# Patient Record
Sex: Female | Born: 1980 | Race: White | Hispanic: No | Marital: Single | Smoking: Former smoker
Health system: Southern US, Community
[De-identification: ages and names within clinical notes are randomized; demographics above are authoritative.]

## PROBLEM LIST (undated history)

## (undated) DIAGNOSIS — L0591 Pilonidal cyst without abscess: Secondary | ICD-10-CM

## (undated) HISTORY — DX: Pilonidal cyst without abscess: L05.91

---

## 2003-05-10 HISTORY — PX: PILONIDAL CYST EXCISION: SHX744

## 2013-08-05 ENCOUNTER — Ambulatory Visit: Payer: Self-pay | Admitting: Family Medicine

## 2013-08-08 ENCOUNTER — Ambulatory Visit (INDEPENDENT_AMBULATORY_CARE_PROVIDER_SITE_OTHER): Payer: BC Managed Care – PPO | Admitting: General Surgery

## 2013-08-08 ENCOUNTER — Encounter: Payer: Self-pay | Admitting: General Surgery

## 2013-08-08 VITALS — BP 124/76 | HR 74 | Temp 97.2°F | Resp 14 | Ht 60.0 in | Wt 243.0 lb

## 2013-08-08 DIAGNOSIS — R112 Nausea with vomiting, unspecified: Secondary | ICD-10-CM

## 2013-08-08 DIAGNOSIS — L0591 Pilonidal cyst without abscess: Secondary | ICD-10-CM

## 2013-08-08 MED ORDER — AMOXICILLIN-POT CLAVULANATE 875-125 MG PO TABS: 1.0000 | ORAL_TABLET | Freq: Two times a day (BID) | ORAL | Status: DC

## 2013-08-08 NOTE — Patient Instructions (Signed)
The patient is aware to call back for any questions or concerns.  

## 2013-08-08 NOTE — Progress Notes (Signed)
Patient ID: Diana Cunningham, female   DOB: 1981/04/28, 33 y.o.   MRN: [id number]  Chief Complaint  Patient presents with  . Other    evaluation of pilonidal cyst    HPI Diana Cunningham is a 33 y.o. female here today for a evaluation of pilonidal cyst. States she has been having pain for about a week. States it ruptured just before coming in to appointment today. She has a history of pilonidal cyst excision in 2005. She is accompanied today by her husband.  HPI  Past Medical History  Diagnosis Date  . Pilonidal cyst     Past Surgical History  Procedure Laterality Date  . Pilonidal cyst excision  2005    No family history on file.  Social History History  Substance Use Topics  . Smoking status: Former Smoker -- 1.00 packs/day for 1 years    Types: Cigarettes    Quit date: 05/10/1999  . Smokeless tobacco: Never Used  . Alcohol Use: Yes    Allergies  Allergen Reactions  . Codeine     Current Outpatient Prescriptions  Medication Sig Dispense Refill  . azithromycin (ZITHROMAX) 250 MG tablet Take 1 tablet by mouth daily.      Marland Kitchen PROAIR HFA 108 (90 BASE) MCG/ACT inhaler Inhale 1 puff into the lungs daily at 6 (six) AM.      . amoxicillin-clavulanate (AUGMENTIN) 875-125 MG per tablet Take 1 tablet by mouth 2 (two) times daily.  14 tablet  0  . promethazine (PHENERGAN) 25 MG suppository Place 1 suppository (25 mg total) rectally every 4 (four) hours as needed for nausea or vomiting.  12 each  0   No current facility-administered medications for this visit.    Review of Systems Review of Systems  Constitutional: Negative.   Respiratory: Negative.   Cardiovascular: Negative.     Blood pressure 124/76, pulse 74, temperature 97.2 F (36.2 C), temperature source Oral, resp. rate 14, height 5' (1.524 m), weight 243 lb (110.224 kg), last menstrual period 07/16/2013.  Physical Exam Physical Exam  Constitutional: She is oriented to person, place, and time. She appears  well-developed and well-nourished.  Cardiovascular: Normal rate, regular rhythm and normal heart sounds.   Pulmonary/Chest: Effort normal and breath sounds normal.  Neurological: She is alert and oriented to person, place, and time.  Skin: Skin is warm and dry.  Examination of the gluteal area shows a 5 x 8 cm area of swelling and erythema. A small draining site is evident.     Assessment    Infected sebaceous cyst, recurrent.     Plan    ChloraPrep, ethyl chloride spray followed by 10 cc of 0.5% Xylocaine with 0.25% Marcaine with 1-200,000 units of epinephrine was applied. Good anesthesia was achieved. Repeat ChloraPrep application followed by wedge excision of a 2 cm area of skin draining approximately 60-100 cc of purulent material. The cavity was irrigated with peroxide. Bulky dressing applied. Culture obtained. Procedure well tolerated. We'll arrange for follow up examination next week.     PCP: Philbert Riser., Kizzie Furnish, Forest Gleason 08/10/2013, 7:11 AM

## 2013-08-09 ENCOUNTER — Telehealth: Payer: Self-pay | Admitting: General Surgery

## 2013-08-09 MED ORDER — PROMETHAZINE HCL 25 MG RE SUPP: 25.0000 mg | RECTAL | Status: DC | PRN

## 2013-08-09 NOTE — Telephone Encounter (Signed)
Patient took first Augmentin today at 1 PM. Developed N&V four hours later.  She reported no antibiotic allergies, but today reports that perhaps she had trouble w/ N&V w/ augmentin in the past.  RX for Phenergan supp sent to her pharmacy. Clear liquids today.  If GI issues resolved, repeat trial tomorrow. D/C if further GI issues.   Reports abscess area markedly improved.   To call tomorrow if persistent symptoms.

## 2013-08-10 DIAGNOSIS — L0591 Pilonidal cyst without abscess: Secondary | ICD-10-CM | POA: Insufficient documentation

## 2013-08-15 ENCOUNTER — Ambulatory Visit: Payer: BC Managed Care – PPO | Admitting: *Deleted

## 2013-08-15 DIAGNOSIS — L0591 Pilonidal cyst without abscess: Secondary | ICD-10-CM

## 2013-08-15 NOTE — Patient Instructions (Signed)
Recommend paper tape and telfa dressing to reduce irritation of surrounding skin.

## 2013-08-15 NOTE — Progress Notes (Signed)
Recommend paper tape and telfa dressing to reduce irritation of surrounding skin. Area healing well. States she had to stop the antibiotic due to nausea and vomiting. States the area feels much better than before.

## 2013-08-19 LAB — ANAEROBIC AND AEROBIC CULTURE

## 2013-08-22 ENCOUNTER — Encounter: Payer: Self-pay | Admitting: General Surgery

## 2013-08-22 ENCOUNTER — Ambulatory Visit (INDEPENDENT_AMBULATORY_CARE_PROVIDER_SITE_OTHER): Payer: BC Managed Care – PPO | Admitting: General Surgery

## 2013-08-22 VITALS — BP 124/74 | HR 76 | Resp 12 | Ht 61.0 in | Wt 245.0 lb

## 2013-08-22 DIAGNOSIS — L0591 Pilonidal cyst without abscess: Secondary | ICD-10-CM

## 2013-08-22 NOTE — Patient Instructions (Signed)
Patient to return 2 weeks

## 2013-08-22 NOTE — Progress Notes (Signed)
Patient ID: Diana Cunningham, female   DOB: 08-Feb-1981, 33 y.o.   MRN: [id number]  Chief Complaint  Patient presents with  . Follow-up    pilonidal cyst    HPI Diana Cunningham is a 33 y.o. female here today following up from a pilonidal cyst. Patient states the area is nor draining or no redness at this time.  The patient experienced significant nausea and vomiting with the use of Augmentin. Repeat exposure had the same effect. She only took 2 doses of antibiotics, if she is having tremendous improvement in the pilonidal abscess with complete resolution of her pain. HPI  Past Medical History  Diagnosis Date  . Pilonidal cyst     Past Surgical History  Procedure Laterality Date  . Pilonidal cyst excision  2005    No family history on file.  Social History History  Substance Use Topics  . Smoking status: Former Smoker -- 1.00 packs/day for 1 years    Types: Cigarettes    Quit date: 05/10/1999  . Smokeless tobacco: Never Used  . Alcohol Use: Yes    Allergies  Allergen Reactions  . Augmentin [Amoxicillin-Pot Clavulanate]     Sick on stomach  . Codeine     Current Outpatient Prescriptions  Medication Sig Dispense Refill  . PROAIR HFA 108 (90 BASE) MCG/ACT inhaler Inhale 1 puff into the lungs daily at 6 (six) AM.      . promethazine (PHENERGAN) 25 MG suppository Place 1 suppository (25 mg total) rectally every 4 (four) hours as needed for nausea or vomiting.  12 each  0  . amoxicillin-clavulanate (AUGMENTIN) 875-125 MG per tablet Take 1 tablet by mouth daily.       No current facility-administered medications for this visit.    Review of Systems Review of Systems  Constitutional: Negative.   Respiratory: Negative.   Cardiovascular: Negative.     Blood pressure 124/74, pulse 76, resp. rate 12, height 5\' 1"  (1.549 m), weight 245 lb (111.131 kg), last menstrual period 07/16/2013.  Physical Exam Physical Exam  Genitourinary:  Drained 1oz  Skin:       Data  Reviewed Cultures showed clostridia and Proventela  Assessment    Doing well status post drainage of pilonidal abscess.     Plan    We'll plan a repeat exam in 3 weeks. The possibility of recurrent surgical excision was reviewed.     PCP: Philbert Riser., Leatha Gilding Byrnett 08/22/2013, 9:18 PM

## 2013-09-12 ENCOUNTER — Ambulatory Visit (INDEPENDENT_AMBULATORY_CARE_PROVIDER_SITE_OTHER): Payer: BC Managed Care – PPO | Admitting: General Surgery

## 2013-09-12 VITALS — BP 124/80 | HR 76 | Resp 12 | Ht 65.0 in | Wt 248.0 lb

## 2013-09-12 DIAGNOSIS — L0591 Pilonidal cyst without abscess: Secondary | ICD-10-CM

## 2013-09-12 NOTE — Progress Notes (Signed)
Patient ID: Diana Cunningham, female   DOB: 07-17-1980, 33 y.o.   MRN: [id number]  Chief Complaint  Patient presents with  . Follow-up    pilonidal abscess    HPI Diana Cunningham is a 33 y.o. female female here today following up from a pilonidal cyst. Patient states the area is nor draining or no redness at this time.   HPI  Past Medical History  Diagnosis Date  . Pilonidal cyst     Past Surgical History  Procedure Laterality Date  . Pilonidal cyst excision  2005    No family history on file.  Social History History  Substance Use Topics  . Smoking status: Former Smoker -- 1.00 packs/day for 1 years    Types: Cigarettes    Quit date: 05/10/1999  . Smokeless tobacco: Never Used  . Alcohol Use: Yes    Allergies  Allergen Reactions  . Augmentin [Amoxicillin-Pot Clavulanate]     Sick on stomach  . Codeine     Current Outpatient Prescriptions  Medication Sig Dispense Refill  . amoxicillin-clavulanate (AUGMENTIN) 875-125 MG per tablet Take 1 tablet by mouth daily.      Marland Kitchen PROAIR HFA 108 (90 BASE) MCG/ACT inhaler Inhale 1 puff into the lungs daily at 6 (six) AM.      . promethazine (PHENERGAN) 25 MG suppository Place 1 suppository (25 mg total) rectally every 4 (four) hours as needed for nausea or vomiting.  12 each  0   No current facility-administered medications for this visit.    Review of Systems Review of Systems  Constitutional: Negative.   Respiratory: Negative.   Cardiovascular: Negative.     Blood pressure 124/80, pulse 76, resp. rate 12, height 5\' 5"  (1.651 m), weight 248 lb (112.492 kg).  Physical Exam Physical Exam  Constitutional: She is oriented to person, place, and time. She appears well-developed and well-nourished.  Neurological: She is alert and oriented to person, place, and time.  Skin: Skin is warm and dry.  Examination of the natal cleft shows no induration, thickening or tenderness. The incision and drainage site has healed completely.  No fluctuance.     Assessment    Complete resolution of gluteal abscess    Plan     at this time I don't see indication for reexploration. The patient was encouraged to call if she develops any recurrent symptoms. Follow up otherwise will be on an as-needed basis.     PCP: Philbert Riser., Leatha Gilding Byrnett 09/15/2013, 8:39 AM

## 2013-09-12 NOTE — Patient Instructions (Signed)
Patient to return as needed. 

## 2014-03-09 ENCOUNTER — Ambulatory Visit: Payer: Self-pay | Admitting: Physician Assistant

## 2014-03-09 LAB — RAPID STREP-A WITH REFLX: MICRO TEXT REPORT: NEGATIVE

## 2014-03-10 ENCOUNTER — Encounter: Payer: Self-pay | Admitting: General Surgery

## 2014-03-12 LAB — BETA STREP CULTURE(ARMC)

## 2014-05-09 LAB — HM DIABETES EYE EXAM

## 2014-06-23 LAB — HM PAP SMEAR

## 2014-08-10 ENCOUNTER — Emergency Department: Admit: 2014-08-10 | Disposition: A | Discharge status: Home / Self Care | Payer: Self-pay | Admitting: Family Medicine

## 2014-10-16 ENCOUNTER — Other Ambulatory Visit: Payer: Self-pay | Admitting: Family Medicine

## 2014-10-16 DIAGNOSIS — M79601 Pain in right arm: Secondary | ICD-10-CM

## 2014-10-30 ENCOUNTER — Other Ambulatory Visit: Payer: Self-pay | Admitting: Family Medicine

## 2015-01-09 ENCOUNTER — Ambulatory Visit: Payer: Self-pay | Admitting: Family Medicine

## 2015-01-09 ENCOUNTER — Encounter: Payer: Self-pay | Admitting: Family Medicine

## 2015-01-09 ENCOUNTER — Ambulatory Visit (INDEPENDENT_AMBULATORY_CARE_PROVIDER_SITE_OTHER): Payer: BLUE CROSS/BLUE SHIELD | Admitting: Family Medicine

## 2015-01-09 VITALS — BP 123/82 | HR 73 | Temp 98.4°F | Resp 16 | Ht 65.0 in | Wt 232.0 lb

## 2015-01-09 DIAGNOSIS — F32A Depression, unspecified: Secondary | ICD-10-CM

## 2015-01-09 DIAGNOSIS — E119 Type 2 diabetes mellitus without complications: Secondary | ICD-10-CM

## 2015-01-09 DIAGNOSIS — Z23 Encounter for immunization: Secondary | ICD-10-CM | POA: Diagnosis not present

## 2015-01-09 DIAGNOSIS — F329 Major depressive disorder, single episode, unspecified: Secondary | ICD-10-CM | POA: Diagnosis not present

## 2015-01-09 DIAGNOSIS — S6992XA Unspecified injury of left wrist, hand and finger(s), initial encounter: Secondary | ICD-10-CM | POA: Diagnosis not present

## 2015-01-09 LAB — POCT GLYCOSYLATED HEMOGLOBIN (HGB A1C): Hemoglobin A1C: 6.7

## 2015-01-09 MED ORDER — ESCITALOPRAM OXALATE 20 MG PO TABS: 20.0000 mg | ORAL_TABLET | Freq: Every day | ORAL | Status: DC

## 2015-01-09 MED ORDER — METFORMIN HCL 850 MG PO TABS: 850.0000 mg | ORAL_TABLET | Freq: Two times a day (BID) | ORAL | Status: DC

## 2015-01-09 NOTE — Patient Instructions (Signed)
Discussed weight loss and calorie reduction.  Cont. meds not changed.  Tdap today.
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
## 2015-02-20 ENCOUNTER — Ambulatory Visit (INDEPENDENT_AMBULATORY_CARE_PROVIDER_SITE_OTHER): Payer: BLUE CROSS/BLUE SHIELD | Admitting: Family Medicine

## 2015-02-20 ENCOUNTER — Encounter (INDEPENDENT_AMBULATORY_CARE_PROVIDER_SITE_OTHER): Payer: Self-pay

## 2015-02-20 ENCOUNTER — Encounter: Payer: Self-pay | Admitting: Family Medicine

## 2015-02-20 VITALS — BP 122/86 | HR 81 | Temp 97.9°F | Resp 16 | Ht 65.0 in | Wt 233.2 lb

## 2015-02-20 DIAGNOSIS — E119 Type 2 diabetes mellitus without complications: Secondary | ICD-10-CM

## 2015-02-20 DIAGNOSIS — E1169 Type 2 diabetes mellitus with other specified complication: Secondary | ICD-10-CM | POA: Insufficient documentation

## 2015-02-20 DIAGNOSIS — F329 Major depressive disorder, single episode, unspecified: Secondary | ICD-10-CM

## 2015-02-20 DIAGNOSIS — F32A Depression, unspecified: Secondary | ICD-10-CM

## 2015-02-20 DIAGNOSIS — F3341 Major depressive disorder, recurrent, in partial remission: Secondary | ICD-10-CM | POA: Insufficient documentation

## 2015-02-20 DIAGNOSIS — F3342 Major depressive disorder, recurrent, in full remission: Secondary | ICD-10-CM | POA: Insufficient documentation

## 2015-02-20 NOTE — Patient Instructions (Signed)
Continue all current meds at current doses.

## 2015-02-20 NOTE — Progress Notes (Signed)
Name: Diana Cunningham   MRN: [id number]    DOB: 10-03-1980   Date:02/20/2015       Progress Note  Subjective  Chief Complaint  Chief Complaint  Patient presents with  . Diabetes    month follow up highest BS 200 and lowest 85 and avarage is 140    Diabetes Pertinent negatives for hypoglycemia include no dizziness, headaches or nervousness/anxiousness. Pertinent negatives for diabetes include no blurred vision, no chest pain, no weakness and no weight loss.   For f/u of DM.  BSs less on higher dose of Metformin.  Range 85-200 with most in 120-160 range.  Avg. About 140.  This is less than before.  She is trying to eat less and exercising 2-3 days a week.  No problem-specific assessment & plan notes found for this encounter.   Past Medical History  Diagnosis Date  . Pilonidal cyst     Social History  Substance Use Topics  . Smoking status: Former Smoker -- 1.00 packs/day for 1 years    Types: Cigarettes    Quit date: 05/10/1999  . Smokeless tobacco: Never Used  . Alcohol Use: 0.0 oz/week    0 Standard drinks or equivalent per week     Comment: on occasion     Current outpatient prescriptions:  .  escitalopram (LEXAPRO) 20 MG tablet, Take 1 tablet (20 mg total) by mouth daily., Disp: 30 tablet, Rfl: 6 .  losartan (COZAAR) 25 MG tablet, Take 0.5 tablets by mouth daily., Disp: , Rfl: 2 .  meloxicam (MOBIC) 7.5 MG tablet, Take 15 mg by mouth daily., Disp: , Rfl: 0 .  metFORMIN (GLUCOPHAGE) 850 MG tablet, Take 1 tablet (850 mg total) by mouth 2 (two) times daily with a meal., Disp: 30 tablet, Rfl: 12  Allergies  Allergen Reactions  . Augmentin [Amoxicillin-Pot Clavulanate]     Sick on stomach  . Codeine     Review of Systems  Constitutional: Negative for fever, chills, weight loss and malaise/fatigue.  HENT: Negative for hearing loss.   Eyes: Negative for blurred vision and double vision.  Respiratory: Positive for cough (last week; getting bettter). Negative for  shortness of breath and wheezing.   Cardiovascular: Negative for chest pain, palpitations, orthopnea and leg swelling.  Gastrointestinal: Negative for heartburn, abdominal pain and blood in stool.  Genitourinary: Negative for dysuria, urgency and frequency.  Musculoskeletal: Negative for myalgias and joint pain.  Skin: Negative for rash.  Neurological: Negative for dizziness, weakness and headaches.  Psychiatric/Behavioral: Negative for depression. The patient is not nervous/anxious and does not have insomnia.       Objective  Filed Vitals:   02/20/15 0915  BP: 122/86  Pulse: 81  Temp: 97.9 F (36.6 C)  TempSrc: Oral  Resp: 16  Height: 5\' 5"  (1.651 m)  Weight: 233 lb 3.2 oz (105.779 kg)     Physical Exam  Constitutional: She is oriented to person, place, and time and well-developed, well-nourished, and in no distress. No distress.  HENT:  Head: Normocephalic and atraumatic.  Eyes: Conjunctivae and EOM are normal. Pupils are equal, round, and reactive to light. No scleral icterus.  Neck: Normal range of motion. Neck supple. Carotid bruit is not present. No thyromegaly present.  Cardiovascular: Normal rate, regular rhythm, normal heart sounds and intact distal pulses.  Exam reveals no gallop and no friction rub.   No murmur heard. Pulmonary/Chest: Effort normal and breath sounds normal. No respiratory distress. She has no wheezes. She has no rales.  Abdominal: Soft. Bowel sounds are normal. She exhibits no distension, no abdominal bruit and no mass. There is no tenderness.  Musculoskeletal: Normal range of motion. She exhibits no edema.  Lymphadenopathy:    She has no cervical adenopathy.  Neurological: She is alert and oriented to person, place, and time.  Vitals reviewed.     Recent Results (from the past 2160 hour(s))  POCT HgB A1C     Status: Abnormal   Collection Time: 01/09/15  2:19 PM  Result Value Ref Range   Hemoglobin A1C 6.7 %      Assessment &  Plan  1. Type 2 diabetes mellitus without complication, without long-term current use of insulin (HCC) -cont. meds  2. Depression -cont. meds

## 2015-02-25 ENCOUNTER — Other Ambulatory Visit: Payer: Self-pay | Admitting: Family Medicine

## 2015-06-01 ENCOUNTER — Ambulatory Visit: Payer: BLUE CROSS/BLUE SHIELD | Admitting: Family Medicine

## 2015-06-11 ENCOUNTER — Encounter: Payer: Self-pay | Admitting: Family Medicine

## 2015-06-11 ENCOUNTER — Ambulatory Visit: Payer: BLUE CROSS/BLUE SHIELD | Admitting: Family Medicine

## 2015-06-11 ENCOUNTER — Ambulatory Visit (INDEPENDENT_AMBULATORY_CARE_PROVIDER_SITE_OTHER): Payer: BLUE CROSS/BLUE SHIELD | Admitting: Family Medicine

## 2015-06-11 VITALS — BP 110/70 | HR 86 | Temp 98.5°F | Resp 16 | Ht 65.0 in | Wt 239.0 lb

## 2015-06-11 VITALS — BP 110/70 | HR 76 | Resp 16 | Ht 67.0 in | Wt 239.4 lb

## 2015-06-11 DIAGNOSIS — E119 Type 2 diabetes mellitus without complications: Secondary | ICD-10-CM

## 2015-06-11 DIAGNOSIS — F32A Depression, unspecified: Secondary | ICD-10-CM

## 2015-06-11 DIAGNOSIS — F329 Major depressive disorder, single episode, unspecified: Secondary | ICD-10-CM

## 2015-06-11 LAB — POCT GLYCOSYLATED HEMOGLOBIN (HGB A1C): HEMOGLOBIN A1C: 6.5

## 2015-06-11 NOTE — Progress Notes (Deleted)
HPI        ROS  Physical Exam  .

## 2015-06-11 NOTE — Progress Notes (Deleted)
This encounter was created in error - please disregard.

## 2015-06-11 NOTE — Addendum Note (Signed)
Addended by: Theresia Majors A on: 06/11/2015 03:18 PM   Modules accepted: Orders, Level of Service, SmartSet

## 2015-06-11 NOTE — Addendum Note (Signed)
Addended by: Larene Beach on: 06/11/2015 03:34 PM   Modules accepted: Medications

## 2015-06-11 NOTE — Addendum Note (Signed)
Addended by: Theresia Majors A on: 06/11/2015 03:18 PM   Modules accepted: Orders, Medications, Level of Service, SmartSet

## 2015-06-11 NOTE — Addendum Note (Signed)
Addended by: Theresia Majors A on: 06/11/2015 03:07 PM   Modules accepted: Level of Service, SmartSet

## 2015-06-11 NOTE — Addendum Note (Signed)
Addended by: Theresia Majors A on: 06/11/2015 03:03 PM   Modules accepted: Medications

## 2015-06-11 NOTE — Progress Notes (Signed)
Name: Diana Cunningham   MRN: [id number]    DOB: 23-Dec-1980   Date:06/11/2015       Progress Note  Subjective  Chief Complaint  Chief Complaint  Patient presents with  . Diabetes    HPI Here for f/u of DM.  Also with Depression.  BSs run from 90-220, but A1c is good.  No c/o.   No problem-specific assessment & plan notes found for this encounter.   Past Medical History  Diagnosis Date  . Pilonidal cyst     Past Surgical History  Procedure Laterality Date  . Pilonidal cyst excision  2005    Family History  Problem Relation Age of Onset  . Hypertension Mother   . Depression Mother     Social History   Social History  . Marital Status: Single    Spouse Name: N/A  . Number of Children: N/A  . Years of Education: N/A   Occupational History  . Not on file.   Social History Main Topics  . Smoking status: Former Smoker -- 1.00 packs/day for 1 years    Types: Cigarettes    Quit date: 05/10/1999  . Smokeless tobacco: Never Used  . Alcohol Use: 0.0 oz/week    0 Standard drinks or equivalent per week     Comment: on occasion  . Drug Use: No  . Sexual Activity: Not on file   Other Topics Concern  . Not on file   Social History Narrative     Current outpatient prescriptions:  .  escitalopram (LEXAPRO) 20 MG tablet, Take 1 tablet (20 mg total) by mouth daily., Disp: 30 tablet, Rfl: 6 .  losartan (COZAAR) 25 MG tablet, TAKE 1/2 TABLET BY MOUTH EVERY DAY, Disp: 15 tablet, Rfl: 6 .  metFORMIN (GLUCOPHAGE) 850 MG tablet, Take 1 tablet (850 mg total) by mouth 2 (two) times daily with a meal. (Patient taking differently: Take 850 mg by mouth daily with breakfast. Taking only once daily), Disp: 30 tablet, Rfl: 12 .  ONE TOUCH ULTRA TEST test strip, U UTD BID, Disp: , Rfl: 0 .  ONETOUCH DELICA LANCETS [id number] MISC, U UTD BID, Disp: , Rfl: 0  Allergies  Allergen Reactions  . Augmentin [Amoxicillin-Pot Clavulanate]     Sick on stomach  . Codeine      Review of Systems   Constitutional: Negative for fever, chills, weight loss and malaise/fatigue.  HENT: Negative for hearing loss.   Eyes: Negative for blurred vision and double vision.  Respiratory: Negative for cough, shortness of breath and wheezing.   Cardiovascular: Negative for chest pain, palpitations and leg swelling.  Gastrointestinal: Negative for heartburn, abdominal pain and blood in stool.  Genitourinary: Negative for dysuria, urgency and frequency.  Skin: Negative for rash.  Neurological: Negative for tremors, weakness and headaches.  Psychiatric/Behavioral: Positive for depression. The patient has insomnia.       Objective  Filed Vitals:   06/11/15 1541  BP: 110/70  Pulse: 76  Resp: 16  Height: 5\' 7"  (1.702 m)  Weight: 239 lb 6.4 oz (108.591 kg)  SpO2: 98%    Physical Exam  Constitutional: She is oriented to person, place, and time and well-developed, well-nourished, and in no distress. No distress.  HENT:  Head: Normocephalic and atraumatic.  Eyes: Conjunctivae and EOM are normal. Pupils are equal, round, and reactive to light. No scleral icterus.  Neck: Normal range of motion. Neck supple. Carotid bruit is not present. No thyromegaly present.  Cardiovascular: Normal rate, regular rhythm and  normal heart sounds.  Exam reveals no gallop and no friction rub.   No murmur heard. Pulmonary/Chest: Effort normal and breath sounds normal. No respiratory distress. She has no wheezes. She has no rales.  Abdominal: Soft. Bowel sounds are normal. She exhibits no distension and no mass. There is no tenderness.  Musculoskeletal: She exhibits no edema.  Lymphadenopathy:    She has no cervical adenopathy.  Neurological: She is alert and oriented to person, place, and time.  Vitals reviewed.      Recent Results (from the past 2160 hour(s))  POCT HgB A1C     Status: Abnormal   Collection Time: 06/11/15  3:43 PM  Result Value Ref Range   Hemoglobin A1C 6.5      Assessment &  Plan  Problem List Items Addressed This Visit      Endocrine   Diabetes ([place]) - Primary     Other   Depression      No orders of the defined types were placed in this encounter.   1. Type 2 diabetes mellitus without complication, unspecified long term insulin use status (HCC) Cont Metformin and Losartan Discussed weight loss again today. 2. Depression Cont. Lexapro.

## 2015-09-12 ENCOUNTER — Other Ambulatory Visit: Payer: Self-pay | Admitting: Family Medicine

## 2015-09-17 ENCOUNTER — Ambulatory Visit: Payer: BLUE CROSS/BLUE SHIELD | Admitting: Family Medicine

## 2015-10-15 ENCOUNTER — Ambulatory Visit: Payer: BLUE CROSS/BLUE SHIELD | Admitting: Family Medicine

## 2015-10-28 ENCOUNTER — Ambulatory Visit (INDEPENDENT_AMBULATORY_CARE_PROVIDER_SITE_OTHER): Payer: BLUE CROSS/BLUE SHIELD | Admitting: Family Medicine

## 2015-10-28 ENCOUNTER — Encounter: Payer: Self-pay | Admitting: Family Medicine

## 2015-10-28 ENCOUNTER — Ambulatory Visit: Payer: BLUE CROSS/BLUE SHIELD | Admitting: Family Medicine

## 2015-10-28 DIAGNOSIS — E119 Type 2 diabetes mellitus without complications: Secondary | ICD-10-CM | POA: Diagnosis not present

## 2015-10-28 DIAGNOSIS — F329 Major depressive disorder, single episode, unspecified: Secondary | ICD-10-CM | POA: Diagnosis not present

## 2015-10-28 DIAGNOSIS — E118 Type 2 diabetes mellitus with unspecified complications: Secondary | ICD-10-CM | POA: Diagnosis not present

## 2015-10-28 DIAGNOSIS — F32A Depression, unspecified: Secondary | ICD-10-CM

## 2015-10-28 LAB — POCT GLYCOSYLATED HEMOGLOBIN (HGB A1C): Hemoglobin A1C: 7.3

## 2015-10-28 MED ORDER — METFORMIN HCL 850 MG PO TABS: 850.0000 mg | ORAL_TABLET | Freq: Every day | ORAL | Status: DC

## 2015-10-28 MED ORDER — ESCITALOPRAM OXALATE 10 MG PO TABS: 10.0000 mg | ORAL_TABLET | Freq: Every day | ORAL | Status: DC

## 2015-10-28 NOTE — Patient Instructions (Signed)
Bring "Thrive" ingredients next visit.

## 2015-10-28 NOTE — Addendum Note (Signed)
Addended by: Frederich Cha D on: 10/28/2015 09:58 AM   Modules accepted: Orders

## 2015-10-28 NOTE — Progress Notes (Signed)
Name: Diana Cunningham   MRN: [id number]    DOB: 1981-02-14   Date:10/28/2015       Progress Note  Subjective  Chief Complaint  Chief Complaint  Patient presents with  . Diabetes    3 month highest BS 215 and lowest BS 105 and avarage 150    HPI Here to f/u DM, depression.  She says that she feels better overall esp with depression since starting an OTC Vitamin supplement called Thrive.  Says sugar has been out of control until 2 weeks ago when she started the vitamin/herbal supplement.  She is sleeping better.  She has stopped caffeine.  She is only taking Metformin 850, once daily for m any months. No problem-specific assessment & plan notes found for this encounter.   Past Medical History  Diagnosis Date  . Pilonidal cyst     Past Surgical History  Procedure Laterality Date  . Pilonidal cyst excision  2005    Family History  Problem Relation Age of Onset  . Hypertension Mother   . Depression Mother     Social History   Social History  . Marital Status: Single    Spouse Name: N/A  . Number of Children: N/A  . Years of Education: N/A   Occupational History  . Not on file.   Social History Main Topics  . Smoking status: Former Smoker -- 1.00 packs/day for 1 years    Types: Cigarettes    Quit date: 05/10/1999  . Smokeless tobacco: Never Used  . Alcohol Use: 0.0 oz/week    0 Standard drinks or equivalent per week     Comment: on occasion  . Drug Use: No  . Sexual Activity: Not on file   Other Topics Concern  . Not on file   Social History Narrative     Current outpatient prescriptions:  .  escitalopram (LEXAPRO) 10 MG tablet, Take 1 tablet (10 mg total) by mouth daily., Disp: 30 tablet, Rfl: 6 .  losartan (COZAAR) 25 MG tablet, TAKE 1/2 TABLET BY MOUTH EVERY DAY, Disp: 15 tablet, Rfl: 6 .  metFORMIN (GLUCOPHAGE) 850 MG tablet, Take 1 tablet (850 mg total) by mouth daily with breakfast., Disp: 30 tablet, Rfl: 12 .  ONE TOUCH ULTRA TEST test strip, U UTD BID,  Disp: , Rfl: 0 .  ONETOUCH DELICA LANCETS 99991111 MISC, U UTD BID, Disp: , Rfl: 0  Allergies  Allergen Reactions  . Augmentin [Amoxicillin-Pot Clavulanate]     Sick on stomach  . Codeine      Review of Systems  Constitutional: Negative for fever, chills, weight loss and malaise/fatigue.  HENT: Negative for hearing loss.   Eyes: Negative for blurred vision and double vision.  Respiratory: Negative for cough, shortness of breath and wheezing.   Cardiovascular: Negative for chest pain, palpitations and leg swelling.  Gastrointestinal: Negative for heartburn, abdominal pain and blood in stool.  Genitourinary: Negative for dysuria, urgency and frequency.  Musculoskeletal: Negative for myalgias and joint pain.  Skin: Negative for rash.  Neurological: Negative for weakness and headaches.  Psychiatric/Behavioral: Negative for depression. The patient is not nervous/anxious and does not have insomnia.       Objective  Filed Vitals:   10/28/15 0801  BP: 140/87  Pulse: 86  Temp: 98.5 F (36.9 C)  TempSrc: Oral  Resp: 16  Height: 5\' 7"  (1.702 m)  Weight: 242 lb 12.8 oz (110.133 kg)    Physical Exam  Constitutional: She is oriented to person, place, and time  and well-developed, well-nourished, and in no distress. No distress.  HENT:  Head: Normocephalic and atraumatic.  Eyes: Conjunctivae and EOM are normal. Pupils are equal, round, and reactive to light. No scleral icterus.  Neck: Normal range of motion. Neck supple. Carotid bruit is not present. No thyromegaly present.  Cardiovascular: Normal rate, regular rhythm and normal heart sounds.  Exam reveals no gallop and no friction rub.   No murmur heard. Pulmonary/Chest: Effort normal and breath sounds normal. No respiratory distress. She has no wheezes. She has no rales.  Abdominal: Soft. Bowel sounds are normal. She exhibits no distension, no abdominal bruit and no mass. There is no tenderness.  Musculoskeletal: She exhibits no  edema.  Lymphadenopathy:    She has no cervical adenopathy.  Neurological: She is alert and oriented to person, place, and time.  Psychiatric:  Affect very good today.  Vitals reviewed.      No results found for this or any previous visit (from the past 2160 hour(s)).   Assessment & Plan  Problem List Items Addressed This Visit      Endocrine   Controlled diabetes mellitus type 2 with complications ([place]) - Primary   Relevant Medications   metFORMIN (GLUCOPHAGE) 850 MG tablet     Other   Depression   Relevant Medications   escitalopram (LEXAPRO) 10 MG tablet      Meds ordered this encounter  Medications  . metFORMIN (GLUCOPHAGE) 850 MG tablet    Sig: Take 1 tablet (850 mg total) by mouth daily with breakfast.    Dispense:  30 tablet    Refill:  12  . escitalopram (LEXAPRO) 10 MG tablet    Sig: Take 1 tablet (10 mg total) by mouth daily.    Dispense:  30 tablet    Refill:  6   1. Controlled type 2 diabetes mellitus with complication, without long-term current use of insulin (HCC) Cont Losartan - metFORMIN (GLUCOPHAGE) 850 MG tablet; Take 1 tablet (850 mg total) by mouth daily with breakfast.  Dispense: 30 tablet; Refill: 12  2. Depression  - escitalopram (LEXAPRO) 10 MG tablet; Take 1 tablet (10 mg total) by mouth daily.  Dispense: 30 tablet; Refill: 6

## 2015-11-20 ENCOUNTER — Other Ambulatory Visit: Payer: Self-pay | Admitting: Family Medicine

## 2016-01-05 ENCOUNTER — Ambulatory Visit: Payer: BLUE CROSS/BLUE SHIELD | Admitting: Family Medicine

## 2016-02-04 ENCOUNTER — Encounter: Payer: Self-pay | Admitting: Family Medicine

## 2016-02-04 ENCOUNTER — Ambulatory Visit (INDEPENDENT_AMBULATORY_CARE_PROVIDER_SITE_OTHER): Payer: 59 | Admitting: Family Medicine

## 2016-02-04 VITALS — BP 117/74 | HR 85 | Temp 98.1°F | Resp 16 | Ht 65.0 in | Wt 240.0 lb

## 2016-02-04 DIAGNOSIS — F32A Depression, unspecified: Secondary | ICD-10-CM

## 2016-02-04 DIAGNOSIS — F329 Major depressive disorder, single episode, unspecified: Secondary | ICD-10-CM | POA: Diagnosis not present

## 2016-02-04 DIAGNOSIS — E118 Type 2 diabetes mellitus with unspecified complications: Secondary | ICD-10-CM

## 2016-02-04 DIAGNOSIS — Z23 Encounter for immunization: Secondary | ICD-10-CM

## 2016-02-04 LAB — POCT GLYCOSYLATED HEMOGLOBIN (HGB A1C): HEMOGLOBIN A1C: 7.3

## 2016-02-04 MED ORDER — METFORMIN HCL 500 MG PO TABS: 500.0000 mg | ORAL_TABLET | Freq: Two times a day (BID) | ORAL | 3 refills | Status: DC

## 2016-02-04 NOTE — Progress Notes (Signed)
Name: Diana Cunningham   MRN: [id number]    DOB: 1980/11/25   Date:02/04/2016       Progress Note  Subjective  Chief Complaint  Chief Complaint  Patient presents with  . Follow-up    Sugar    HPI Here for f/u of DM.  She has not lost much weight.  Only taking Metformin 850 once a day.  She is feeling well overall.  No problem-specific Assessment & Plan notes found for this encounter.   Past Medical History:  Diagnosis Date  . Pilonidal cyst     Past Surgical History:  Procedure Laterality Date  . PILONIDAL CYST EXCISION  2005    Family History  Problem Relation Age of Onset  . Hypertension Mother   . Depression Mother     Social History   Social History  . Marital status: Single    Spouse name: N/A  . Number of children: N/A  . Years of education: N/A   Occupational History  . Not on file.   Social History Main Topics  . Smoking status: Former Smoker    Packs/day: 1.00    Years: 1.00    Types: Cigarettes    Quit date: 05/10/1999  . Smokeless tobacco: Never Used  . Alcohol use 0.0 oz/week     Comment: on occasion  . Drug use: No  . Sexual activity: Yes   Other Topics Concern  . Not on file   Social History Narrative  . No narrative on file     Current Outpatient Prescriptions:  .  escitalopram (LEXAPRO) 10 MG tablet, Take 1 tablet (10 mg total) by mouth daily., Disp: 30 tablet, Rfl: 6 .  losartan (COZAAR) 25 MG tablet, TAKE 1/2 TABLET BY MOUTH EVERY DAY, Disp: 15 tablet, Rfl: 12 .  ONETOUCH DELICA LANCETS 99991111 MISC, U UTD BID, Disp: , Rfl: 0 .  metFORMIN (GLUCOPHAGE) 500 MG tablet, Take 1 tablet (500 mg total) by mouth 2 (two) times daily with a meal., Disp: 180 tablet, Rfl: 3  Allergies  Allergen Reactions  . Augmentin [Amoxicillin-Pot Clavulanate]     Sick on stomach  . Codeine      Review of Systems  Constitutional: Negative for chills, fever, malaise/fatigue and weight loss.  HENT: Positive for ear pain (R; started today). Negative for  hearing loss.   Eyes: Negative for blurred vision and double vision.  Respiratory: Negative for cough, shortness of breath and wheezing.   Cardiovascular: Negative for chest pain, palpitations and leg swelling.  Gastrointestinal: Negative for abdominal pain, blood in stool and heartburn.  Genitourinary: Negative for dysuria, frequency and urgency.  Musculoskeletal: Negative for joint pain and myalgias.  Skin: Negative for rash.  Neurological: Negative for dizziness, tremors, weakness and headaches.      Objective  Vitals:   02/04/16 1058  BP: 117/74  Pulse: 85  Resp: 16  Temp: 98.1 F (36.7 C)  TempSrc: Oral  Weight: 108.9 kg (240 lb)  Height: 5\' 5"  (1.651 m)    Physical Exam  Constitutional: She is oriented to person, place, and time and well-developed, well-nourished, and in no distress. No distress.  HENT:  Head: Normocephalic and atraumatic.  Right Ear: External ear normal.  Left Ear: External ear normal.  Nose: Nose normal.  Mouth/Throat: Oropharynx is clear and moist.  Eyes: Conjunctivae and EOM are normal. Pupils are equal, round, and reactive to light. No scleral icterus.  Neck: Normal range of motion. Neck supple. Carotid bruit is not present. No thyromegaly  present.  Cardiovascular: Normal rate, regular rhythm and normal heart sounds.  Exam reveals no gallop and no friction rub.   No murmur heard. Pulmonary/Chest: Effort normal. No respiratory distress. She has no wheezes. She has no rales.  Abdominal: Soft. Bowel sounds are normal. She exhibits no distension and no mass. There is no tenderness.  obese  Musculoskeletal: She exhibits no edema.  Lymphadenopathy:    She has no cervical adenopathy.  Neurological: She is alert and oriented to person, place, and time.  Vitals reviewed.      Recent Results (from the past 2160 hour(s))  POCT HgB A1C     Status: Abnormal   Collection Time: 02/04/16 11:17 AM  Result Value Ref Range   Hemoglobin A1C 7.3       Assessment & Plan  Problem List Items Addressed This Visit      Endocrine   Controlled diabetes mellitus type 2 with complications ([place]) - Primary   Relevant Medications   metFORMIN (GLUCOPHAGE) 500 MG tablet   Other Relevant Orders   POCT HgB A1C (Completed)     Other   Depression    Other Visit Diagnoses    Need for influenza vaccination       Relevant Orders   Flu Vaccine QUAD 36+ mos PF IM (Fluarix & Fluzone Quad PF) (Completed)   Immunization due       Relevant Orders   Pneumococcal polysaccharide vaccine 23-valent greater than or equal to 2yo subcutaneous/IM      Meds ordered this encounter  Medications  . metFORMIN (GLUCOPHAGE) 500 MG tablet    Sig: Take 1 tablet (500 mg total) by mouth 2 (two) times daily with a meal.    Dispense:  180 tablet    Refill:  3   1. Need for influenza vaccination  - Flu Vaccine QUAD 36+ mos PF IM (Fluarix & Fluzone Quad PF)  2. Controlled type 2 diabetes mellitus with complication, without long-term current use of insulin (HCC)  - POCT HgB A1C-7.3 - metFORMIN (GLUCOPHAGE) 500 MG tablet; Take 1 tablet (500 mg total) by mouth 2 (two) times daily with a meal.  Dispense: 180 tablet; Refill: 3  3. Depression Cont med  4. Immunization due  - Pneumococcal polysaccharide vaccine 23-valent greater than or equal to 2yo subcutaneous/IM

## 2016-02-16 ENCOUNTER — Encounter: Payer: Self-pay | Admitting: *Deleted

## 2016-02-16 ENCOUNTER — Encounter: Payer: Self-pay | Admitting: Family Medicine

## 2016-02-16 ENCOUNTER — Ambulatory Visit (INDEPENDENT_AMBULATORY_CARE_PROVIDER_SITE_OTHER): Payer: 59 | Admitting: Family Medicine

## 2016-02-16 DIAGNOSIS — M25561 Pain in right knee: Secondary | ICD-10-CM

## 2016-02-16 LAB — CBC WITH DIFFERENTIAL/PLATELET
BASOS PCT: 0 %
Basophils Absolute: 0 cells/uL (ref 0–200)
EOS ABS: 470 {cells}/uL (ref 15–500)
Eosinophils Relative: 5 %
HCT: 38 % (ref 35.0–45.0)
Hemoglobin: 12.5 g/dL (ref 11.7–15.5)
Lymphocytes Relative: 26 %
Lymphs Abs: 2444 cells/uL (ref 850–3900)
MCH: 28.4 pg (ref 27.0–33.0)
MCHC: 32.9 g/dL (ref 32.0–36.0)
MCV: 86.4 fL (ref 80.0–100.0)
MONOS PCT: 7 %
MPV: 10.1 fL (ref 7.5–12.5)
Monocytes Absolute: 658 cells/uL (ref 200–950)
NEUTROS ABS: 5828 {cells}/uL (ref 1500–7800)
Neutrophils Relative %: 62 %
PLATELETS: 316 10*3/uL (ref 140–400)
RBC: 4.4 MIL/uL (ref 3.80–5.10)
RDW: 13.7 % (ref 11.0–15.0)
WBC: 9.4 10*3/uL (ref 3.8–10.8)

## 2016-02-16 MED ORDER — INDOMETHACIN 25 MG PO CAPS: 25.0000 mg | ORAL_CAPSULE | Freq: Three times a day (TID) | ORAL | 3 refills | Status: DC | PRN

## 2016-02-16 NOTE — Progress Notes (Signed)
Name: Diana Cunningham   MRN: [id number]    DOB: 10-05-1980   Date:02/16/2016       Progress Note  Subjective  Chief Complaint  Chief Complaint  Patient presents with  . Knee Pain    HPI C/o sudden onset of R knee pain that started late last night and much worse this AM.  Unable to walk without assistance.  No trauma.  Climbed more steps at work yesterday.  No problem-specific Assessment & Plan notes found for this encounter.   Past Medical History:  Diagnosis Date  . Pilonidal cyst     Social History  Substance Use Topics  . Smoking status: Former Smoker    Packs/day: 1.00    Years: 1.00    Types: Cigarettes    Quit date: 05/10/1999  . Smokeless tobacco: Never Used  . Alcohol use 0.0 oz/week     Comment: on occasion     Current Outpatient Prescriptions:  .  escitalopram (LEXAPRO) 10 MG tablet, Take 1 tablet (10 mg total) by mouth daily., Disp: 30 tablet, Rfl: 6 .  losartan (COZAAR) 25 MG tablet, TAKE 1/2 TABLET BY MOUTH EVERY DAY, Disp: 15 tablet, Rfl: 12 .  metFORMIN (GLUCOPHAGE) 500 MG tablet, Take 1 tablet (500 mg total) by mouth 2 (two) times daily with a meal., Disp: 180 tablet, Rfl: 3 .  ONETOUCH DELICA LANCETS 99991111 MISC, U UTD BID, Disp: , Rfl: 0 .  indomethacin (INDOCIN) 25 MG capsule, Take 1 capsule (25 mg total) by mouth 3 (three) times daily as needed. Take with food., Disp: 30 capsule, Rfl: 3  Allergies  Allergen Reactions  . Augmentin [Amoxicillin-Pot Clavulanate]     Sick on stomach  . Codeine     Review of Systems  Constitutional: Negative.   HENT: Negative.   Eyes: Negative.   Respiratory: Negative.   Cardiovascular: Negative.   Gastrointestinal: Negative.   Genitourinary: Negative.   Musculoskeletal: Positive for joint pain (RF knee).  Skin: Negative.       Objective  Vitals:   02/16/16 0914  BP: 139/90  Pulse: 96  Resp: 16  Temp: 97.9 F (36.6 C)  TempSrc: Oral  Weight: 240 lb (108.9 kg)  Height: 5\' 6"  (1.676 m)     Physical  Exam  Constitutional: She is oriented to person, place, and time. She appears distressed.  HENT:  Head: Normocephalic and atraumatic.  Musculoskeletal:  R knee slightly swollen.  Tender to palp post R knee and some laterally.  Pain with ROM.  ? Warm.  Neurological: She is alert and oriented to person, place, and time.  Vitals reviewed.     Recent Results (from the past 2160 hour(s))  POCT HgB A1C     Status: Abnormal   Collection Time: 02/04/16 11:17 AM  Result Value Ref Range   Hemoglobin A1C 7.3      Assessment & Plan  1. Acute pain of right knee  - CBC with Differential - Uric acid - indomethacin (INDOCIN) 25 MG capsule; Take 1 capsule (25 mg total) by mouth 3 (three) times daily as needed. Take with food.  Dispense: 30 capsule; Refill: 3

## 2016-02-17 LAB — URIC ACID: Uric Acid, Serum: 5.9 mg/dL (ref 2.5–7.0)

## 2016-03-10 ENCOUNTER — Encounter: Payer: Self-pay | Admitting: Family Medicine

## 2016-03-10 ENCOUNTER — Ambulatory Visit (INDEPENDENT_AMBULATORY_CARE_PROVIDER_SITE_OTHER): Payer: BLUE CROSS/BLUE SHIELD | Admitting: Family Medicine

## 2016-03-10 VITALS — BP 119/66 | HR 96 | Temp 98.4°F | Resp 16 | Ht 66.0 in | Wt 244.6 lb

## 2016-03-10 DIAGNOSIS — M25561 Pain in right knee: Secondary | ICD-10-CM | POA: Diagnosis not present

## 2016-03-10 DIAGNOSIS — M109 Gout, unspecified: Secondary | ICD-10-CM | POA: Diagnosis not present

## 2016-03-10 MED ORDER — COLCHICINE 0.6 MG PO CAPS: 0.6000 mg | ORAL_CAPSULE | Freq: Every day | ORAL | 0 refills | Status: DC

## 2016-03-10 MED ORDER — MITIGARE 0.6 MG PO CAPS: 0.6000 mg | ORAL_CAPSULE | Freq: Every day | ORAL | 0 refills | Status: DC

## 2016-03-10 NOTE — Patient Instructions (Signed)
Thank you for coming in to clinic today.  1. Consistent with recurrent Gout Flare  Start Colchicine today, do not take Indocin.  Day 1 take 2 tabs 1.2mg , wait 1 hour then take 1 tab (0.6mg ), next day start with 1 tab daily until pain resolves, if after 3 days pain is not resolving then you can increase to 1 tab twice a day until resolved, then start preventative dose daily 1 pill for 1 month.  If pain does not significantly improve within 48 to 72 hours, please notify us and we can send in a Prednisone burst instead, then once resolved switch back to the Preventative Colchicine dose of 1 pill a day for 1 month.  Checking chemistry to see kidney function.  We may still consider the Allopurinol for prevention but have to overlap with preventative medicine as well, discuss at next visit.  Please schedule a follow-up appointment with Dr. Parks Ranger or Dr Luan Pulling in 1 month to follow-up Gout  If you have any other questions or concerns, please feel free to call the clinic or send a message through [facility]. You may also schedule an earlier appointment if necessary.  Nobie Putnam, DO [place]

## 2016-03-10 NOTE — Assessment & Plan Note (Signed)
Clinically consistent with recurrent acute gout flare of Right Knee, onset today < 6 hours, limited ambulation due to difficulty extending R knee with pain / swelling. Concern for gout recurrence with similar more severe flare in R knee acute onset 02/16/16, treated with Indocin resolved 1 week and took indocin for 1-2 more weeks, now recurrence after off for 1 week. - No prior gout history until recently. No confirmation of joint fluid with crystals - Differential Dx - unlikely trauma without known inciting injury, unlikely OA, no sign of infection or systemic symptoms. - Last uric acid level 5.9 (02/16/16)  Plan: 1. Start colchicine 0.6mg  - first dose x 2 then repeat 1 hour, take 1 daily, if pain unresolved over few days inc to BID, continue 2-3 days until pain resolves, then resume 0.6mg  daily for prophylaxis. UPDATE - patient called stating that cost of rx brand/generic would be >$300. I spoke with pharmacist, her ins prefers brand Mitigare (cost due to patient not meeting her deductible) able to use GoodRx coupon, for $80 for 30 pills - Considered repeat NSAID course, if needed for prophylaxis can consider Naproxen 250mg  BID (vs Indocin 25 BID) for weeks to max 6 months - Considered Prednisone burst 8 day taper, however will hold off given DM2 - Check BMET today for Cr 2. Avoid excessive ambulation, relative rest, ice if helps, can take Tylenol PRN 3. Note for work - light duty return today for 1 week, then resume regular duty 4. Avoid food triggers (red meat, alcohol) 5. Follow-up initially advised within 4 weeks, given limited supply of colchicine due to cost, will recommend f/u 1-2 weeks, if pain resolved, will consider starting allopurinol and continue colchicine for up to total 3 months prophylaxis vs NSAID prophylaxis. Discussed still may have benefit with allopurinol despite UA 5.9 which is < 6.0 target goal. In future if recurrence or other concerns, may refer to Rheum vs Ortho for joint  aspiration, crystal studies

## 2016-03-10 NOTE — Progress Notes (Signed)
Subjective:    Patient ID: Diana Cunningham, female    DOB: February 14, 1981, 35 y.o.   MRN: [id number]  Diana Cunningham is a 35 y.o. female presenting on 03/10/2016 for Knee Pain (Right side pt woke up with knee pain today can't walk pt had same Sx last month indocin helped Rx by Dr. Luan Pulling but now Sx reoccured)  Patient presents for a same day appointment.  HPI   Acute Right Knee Pain, Suspected GOUT FLARE: - Last visit 02/16/16, acute R knee, presented within 24 hour osnet, thought to be gout (new diagnosis, no prior gout flares or history), description at that time with acute onset pain, swelling, and warmth without redness. Treated with Indocin 67m TID, eventual resolution within 1 week, then continued Indocin for another 1-2 weeks, she was out of work for several days then did light duty - Today she presents with recurrent R knee flare, similar to initial episode, but not as severe. Last night felt fine without any knee pain, onset symptoms today when woke up with acute R knee pain and swelling, currently moderate pain up to 5/10, last time was more severe, difficulty straightening her leg due to swelling - No known history of gout. Has reviewed diet, and she does not eat red meat or drink alcohol, no other known triggers - Recently has been less active due to prior injury, and denies any fall or trauma - Denies any fevers/chills, other joint pain or swelling   Social History  Substance Use Topics  . Smoking status: Former Smoker    Packs/day: 1.00    Years: 1.00    Types: Cigarettes    Quit date: 05/10/1999  . Smokeless tobacco: Never Used  . Alcohol use 0.0 oz/week     Comment: on occasion    Review of Systems Per HPI unless specifically indicated above     Objective:    BP 119/66   Pulse 96   Temp 98.4 F (36.9 C) (Oral)   Resp 16   Ht '5\' 6"'  (1.676 m)   Wt 244 lb 9.6 oz (110.9 kg)   BMI 39.48 kg/m   Wt Readings from Last 3 Encounters:  03/10/16 244 lb 9.6 oz (110.9 kg)    02/16/16 240 lb (108.9 kg)  02/04/16 240 lb (108.9 kg)    Physical Exam  Constitutional: She appears well-developed and well-nourished. No distress.  Well-appearing, uncomfortable with R knee pain, cooperative  HENT:  Head: Normocephalic and atraumatic.  Mouth/Throat: Oropharynx is clear and moist.  Cardiovascular: Normal rate.   Pulmonary/Chest: Effort normal.  Musculoskeletal:  Bilateral Knees Inspection: Very mild general erythematous appearance to R knee with localized soft tissue swelling and mild effusion, still has appropriate landmarks compared to Left knee. Palpation: Non-tender (patella, med/lat joint line, tendons, posteriorly), no reproducible pain. No crepitus ROM: Full active R knee flexion. Signifiacntly limited R knee extension due to pain and swelling. Strength: 5/5 intact knee flex/ext, ankle dorsi/plantarflex Neurovascular: distally intact sensation light touch and pulses  Neurological: She is alert.  Antalgic gait due to R knee pain difficulty extending, using crutches.  Skin: Skin is warm and dry. No rash noted. She is not diaphoretic.  Nursing note and vitals reviewed.    I have personally reviewed the following lab results from 02/16/16.  Results for orders placed or performed in visit on 02/16/16  CBC with Differential  Result Value Ref Range   WBC 9.4 3.8 - 10.8 K/uL   RBC 4.40 3.80 - 5.10 MIL/uL  Hemoglobin 12.5 11.7 - 15.5 g/dL   HCT 38.0 35.0 - 45.0 %   MCV 86.4 80.0 - 100.0 fL   MCH 28.4 27.0 - 33.0 pg   MCHC 32.9 32.0 - 36.0 g/dL   RDW 13.7 11.0 - 15.0 %   Platelets 316 140 - 400 K/uL   MPV 10.1 7.5 - 12.5 fL   Neutro Abs 5,828 1,500 - 7,800 cells/uL   Lymphs Abs 2,444 850 - 3,900 cells/uL   Monocytes Absolute 658 200 - 950 cells/uL   Eosinophils Absolute 470 15 - 500 cells/uL   Basophils Absolute 0 0 - 200 cells/uL   Neutrophils Relative % 62 %   Lymphocytes Relative 26 %   Monocytes Relative 7 %   Eosinophils Relative 5 %   Basophils  Relative 0 %   Smear Review Criteria for review not met   Uric acid  Result Value Ref Range   Uric Acid, Serum 5.9 2.5 - 7.0 mg/dL      Assessment & Plan:   Problem List Items Addressed This Visit    Knee pain, acute   Acute gout of right knee - Primary    Clinically consistent with recurrent acute gout flare of Right Knee, onset today < 6 hours, limited ambulation due to difficulty extending R knee with pain / swelling. Concern for gout recurrence with similar more severe flare in R knee acute onset 02/16/16, treated with Indocin resolved 1 week and took indocin for 1-2 more weeks, now recurrence after off for 1 week. - No prior gout history until recently. No confirmation of joint fluid with crystals - Differential Dx - unlikely trauma without known inciting injury, unlikely OA, no sign of infection or systemic symptoms. - Last uric acid level 5.9 (02/16/16)  Plan: 1. Start colchicine 0.75m - first dose x 2 then repeat 1 hour, take 1 daily, if pain unresolved over few days inc to BID, continue 2-3 days until pain resolves, then resume 0.665mdaily for prophylaxis. UPDATE - patient called stating that cost of rx brand/generic would be >$300. I spoke with pharmacist, her ins prefers brand Mitigare (cost due to patient not meeting her deductible) able to use GoodRx coupon, for $80 for 30 pills - Considered repeat NSAID course, if needed for prophylaxis can consider Naproxen 25019mID (vs Indocin 25 BID) for weeks to max 6 months - Considered Prednisone burst 8 day taper, however will hold off given DM2 - Check BMET today for Cr 2. Avoid excessive ambulation, relative rest, ice if helps, can take Tylenol PRN 3. Note for work - light duty return today for 1 week, then resume regular duty 4. Avoid food triggers (red meat, alcohol) 5. Follow-up initially advised within 4 weeks, given limited supply of colchicine due to cost, will recommend f/u 1-2 weeks, if pain resolved, will consider starting  allopurinol and continue colchicine for up to total 3 months prophylaxis vs NSAID prophylaxis. Discussed still may have benefit with allopurinol despite UA 5.9 which is < 6.0 target goal. In future if recurrence or other concerns, may refer to Rheum vs Ortho for joint aspiration, crystal studies       Relevant Medications   MITIGARE 0.6 MG CAPS   Other Relevant Orders   BASIC METABOLIC PANEL WITH GFR    Other Visit Diagnoses   None.     Meds ordered this encounter  Medications  . DISCONTD: Colchicine 0.6 MG CAPS    Sig: Take 0.6-1.2 mg by mouth daily. As instructed.  Dispense:  60 capsule    Refill:  0  . MITIGARE 0.6 MG CAPS    Sig: Take 0.6 mg by mouth daily. Take as directed.    Dispense:  30 capsule    Refill:  0      Follow up plan: Return in about 4 weeks (around 04/07/2016) for Gout, R knee.  Nobie Putnam, [facility] 03/10/2016, 1:50 PM

## 2016-03-11 LAB — BASIC METABOLIC PANEL WITH GFR
BUN: 9 mg/dL (ref 7–25)
CHLORIDE: 99 mmol/L (ref 98–110)
CO2: 23 mmol/L (ref 20–31)
CREATININE: 0.7 mg/dL (ref 0.50–1.10)
Calcium: 9.6 mg/dL (ref 8.6–10.2)
GFR, Est African American: >89 mL/min (ref >=60)
GFR, Est Non African American: >89 mL/min (ref >=60)
Glucose, Bld: 258 mg/dL — ABNORMAL HIGH (ref 65–99)
POTASSIUM: 4.2 mmol/L (ref 3.5–5.3)
Sodium: 136 mmol/L (ref 135–146)

## 2016-03-22 ENCOUNTER — Other Ambulatory Visit: Payer: Self-pay | Admitting: Family Medicine

## 2016-03-22 DIAGNOSIS — M25561 Pain in right knee: Secondary | ICD-10-CM

## 2016-03-24 ENCOUNTER — Encounter: Payer: Self-pay | Admitting: Family Medicine

## 2016-03-24 ENCOUNTER — Ambulatory Visit (INDEPENDENT_AMBULATORY_CARE_PROVIDER_SITE_OTHER): Payer: 59 | Admitting: Family Medicine

## 2016-03-24 VITALS — BP 126/78 | HR 96 | Temp 98.3°F | Resp 16 | Ht 66.0 in | Wt 243.0 lb

## 2016-03-24 DIAGNOSIS — M10061 Idiopathic gout, right knee: Secondary | ICD-10-CM

## 2016-03-24 DIAGNOSIS — E118 Type 2 diabetes mellitus with unspecified complications: Secondary | ICD-10-CM

## 2016-03-24 DIAGNOSIS — F3289 Other specified depressive episodes: Secondary | ICD-10-CM | POA: Diagnosis not present

## 2016-03-24 NOTE — Progress Notes (Signed)
Name: Diana Cunningham   MRN: [id number]    DOB: 06/30/1980   Date:03/24/2016       Progress Note  Subjective  Chief Complaint  Chief Complaint  Patient presents with  . Gout    HPI Here for f/u of gout in R knee.  She is on Colchicine twice a day for past week or 10 days.  Knee basically back to normal.  Her BSs have been stable.  BP at home doing well.  She has not had knee tapped or seen Rheumatology.  No problem-specific Assessment & Plan notes found for this encounter.   Past Medical History:  Diagnosis Date  . Pilonidal cyst     Past Surgical History:  Procedure Laterality Date  . PILONIDAL CYST EXCISION  2005    Family History  Problem Relation Age of Onset  . Hypertension Mother   . Depression Mother     Social History   Social History  . Marital status: Single    Spouse name: N/A  . Number of children: N/A  . Years of education: N/A   Occupational History  . Not on file.   Social History Main Topics  . Smoking status: Former Smoker    Packs/day: 1.00    Years: 1.00    Types: Cigarettes    Quit date: 05/10/1999  . Smokeless tobacco: Never Used  . Alcohol use 0.0 oz/week     Comment: on occasion  . Drug use: No  . Sexual activity: Yes   Other Topics Concern  . Not on file   Social History Narrative  . No narrative on file     Current Outpatient Prescriptions:  .  escitalopram (LEXAPRO) 10 MG tablet, Take 1 tablet (10 mg total) by mouth daily., Disp: 30 tablet, Rfl: 6 .  losartan (COZAAR) 25 MG tablet, TAKE 1/2 TABLET BY MOUTH EVERY DAY, Disp: 15 tablet, Rfl: 12 .  metFORMIN (GLUCOPHAGE) 500 MG tablet, Take 1 tablet (500 mg total) by mouth 2 (two) times daily with a meal., Disp: 180 tablet, Rfl: 3 .  MITIGARE 0.6 MG CAPS, Take 0.6 mg by mouth daily. Take as directed., Disp: 30 capsule, Rfl: 0 .  ONETOUCH DELICA LANCETS 29U MISC, U UTD BID, Disp: , Rfl: 0  Allergies  Allergen Reactions  . Augmentin [Amoxicillin-Pot Clavulanate]     Sick on  stomach  . Codeine      Review of Systems  Constitutional: Negative for chills, fever, malaise/fatigue and weight loss.  HENT: Negative for hearing loss and tinnitus.   Eyes: Negative for blurred vision and double vision.  Respiratory: Negative for cough, sputum production, shortness of breath and wheezing.   Cardiovascular: Negative for chest pain, palpitations and leg swelling.  Gastrointestinal: Negative for abdominal pain, blood in stool and heartburn.  Genitourinary: Negative for dysuria, frequency and urgency.  Musculoskeletal: Negative for joint pain and myalgias.  Skin: Negative for rash.  Neurological: Negative for dizziness, tingling, tremors, weakness and headaches.  Psychiatric/Behavioral: Negative for depression. The patient is not nervous/anxious.       Objective  Vitals:   03/24/16 1015 03/24/16 1034  BP: 126/78   Pulse: (!) 105 96  Resp: 16   Temp: 98.3 F (36.8 C)   TempSrc: Oral   Weight: 243 lb (110.2 kg)   Height: _0  (1.676 m)     Physical Exam  Constitutional: She is oriented to person, place, and time and well-developed, well-nourished, and in no distress. No distress.  HENT:  Head:  Normocephalic and atraumatic.  Eyes: Conjunctivae and EOM are normal. Pupils are equal, round, and reactive to light. No scleral icterus.  Neck: Normal range of motion. Neck supple. Carotid bruit is not present. No thyromegaly present.  Cardiovascular: Normal rate, regular rhythm and normal heart sounds.  Exam reveals no gallop and no friction rub.   No murmur heard. Pulmonary/Chest: Effort normal and breath sounds normal. No respiratory distress. She has no wheezes. She has no rales.  Musculoskeletal: She exhibits no edema.  No knee pain today.  Lymphadenopathy:    She has no cervical adenopathy.  Neurological: She is alert and oriented to person, place, and time.  Vitals reviewed.      Recent Results (from the past 2160 hour(s))  POCT HgB A1C     Status:  Abnormal   Collection Time: 02/04/16 11:17 AM  Result Value Ref Range   Hemoglobin A1C 7.3   CBC with Differential     Status: None   Collection Time: 02/16/16 10:25 AM  Result Value Ref Range   WBC 9.4 3.8 - 10.8 K/uL   RBC 4.40 3.80 - 5.10 MIL/uL   Hemoglobin 12.5 11.7 - 15.5 g/dL   HCT 38.0 35.0 - 45.0 %   MCV 86.4 80.0 - 100.0 fL   MCH 28.4 27.0 - 33.0 pg   MCHC 32.9 32.0 - 36.0 g/dL   RDW 13.7 11.0 - 15.0 %   Platelets 316 140 - 400 K/uL   MPV 10.1 7.5 - 12.5 fL   Neutro Abs 5,828 1,500 - 7,800 cells/uL   Lymphs Abs 2,444 850 - 3,900 cells/uL   Monocytes Absolute 658 200 - 950 cells/uL   Eosinophils Absolute 470 15 - 500 cells/uL   Basophils Absolute 0 0 - 200 cells/uL   Neutrophils Relative % 62 %   Lymphocytes Relative 26 %   Monocytes Relative 7 %   Eosinophils Relative 5 %   Basophils Relative 0 %   Smear Review Criteria for review not met   Uric acid     Status: None   Collection Time: 02/16/16 10:25 AM  Result Value Ref Range   Uric Acid, Serum 5.9 2.5 - 7.0 mg/dL    Comment: Therapeutic target for gout patients: <6.0 mg/dL  BASIC METABOLIC PANEL WITH GFR     Status: Abnormal   Collection Time: 03/10/16 11:45 AM  Result Value Ref Range   Sodium 136 135 - 146 mmol/L   Potassium 4.2 3.5 - 5.3 mmol/L   Chloride 99 98 - 110 mmol/L   CO2 23 20 - 31 mmol/L   Glucose, Bld 258 (H) 65 - 99 mg/dL   BUN 9 7 - 25 mg/dL   Creat 0.70 0.50 - 1.10 mg/dL   Calcium 9.6 8.6 - 10.2 mg/dL   GFR, Est African American >89 >=60 mL/min   GFR, Est Non African American >89 >=60 mL/min     Assessment & Plan  Problem List Items Addressed This Visit      Endocrine   Controlled diabetes mellitus type 2 with complications (HCC)     Musculoskeletal and Integument   Acute gout of right knee - Primary     Other   Depression      No orders of the defined types were placed in this encounter.  1. Acute idiopathic gout of right knee Reduced Colchicine to 1/d for next 4-7  days  2. Controlled type 2 diabetes mellitus with complication, without long-term current use of insulin (HCC) Cont metformin  and Losartan.  3. Other depression Cont  Lexapro

## 2016-03-24 NOTE — Patient Instructions (Signed)
Return for any gout flairs.  Needs Ortho or Rheuma referral for joint fluid analysis with any future flairs.  Reduce Colchicine to 1, 0.6 mg tab daily for next 4-7 days then discontinue.

## 2016-05-12 ENCOUNTER — Ambulatory Visit (INDEPENDENT_AMBULATORY_CARE_PROVIDER_SITE_OTHER): Payer: 59 | Admitting: Family Medicine

## 2016-05-12 ENCOUNTER — Encounter: Payer: Self-pay | Admitting: Family Medicine

## 2016-05-12 VITALS — BP 130/80 | HR 92 | Temp 98.9°F | Resp 16 | Ht 66.0 in | Wt 247.0 lb

## 2016-05-12 DIAGNOSIS — E118 Type 2 diabetes mellitus with unspecified complications: Secondary | ICD-10-CM

## 2016-05-12 DIAGNOSIS — F330 Major depressive disorder, recurrent, mild: Secondary | ICD-10-CM | POA: Diagnosis not present

## 2016-05-12 DIAGNOSIS — E119 Type 2 diabetes mellitus without complications: Secondary | ICD-10-CM | POA: Diagnosis not present

## 2016-05-12 DIAGNOSIS — F3289 Other specified depressive episodes: Secondary | ICD-10-CM

## 2016-05-12 LAB — POCT GLYCOSYLATED HEMOGLOBIN (HGB A1C): HEMOGLOBIN A1C: 7.5

## 2016-05-12 MED ORDER — METFORMIN HCL 500 MG PO TABS: ORAL_TABLET | ORAL | 12 refills | Status: DC

## 2016-05-12 MED ORDER — ESCITALOPRAM OXALATE 20 MG PO TABS: 20.0000 mg | ORAL_TABLET | Freq: Every day | ORAL | 6 refills | Status: DC

## 2016-05-12 NOTE — Progress Notes (Signed)
Name: Diana Cunningham   MRN: [id number]    DOB: Apr 02, 1981   Date:05/12/2016       Progress Note  Subjective  Chief Complaint  Chief Complaint  Patient presents with  . Diabetes    highest BS 190 and lowest 110    HPI Here for f/u of HBP and DM.  Also is depressed and anxious with some anxiety attacks occurring.  BSs at home 110-190.  No problem-specific Assessment & Plan notes found for this encounter.   Past Medical History:  Diagnosis Date  . Pilonidal cyst     Past Surgical History:  Procedure Laterality Date  . PILONIDAL CYST EXCISION  2005    Family History  Problem Relation Age of Onset  . Hypertension Mother   . Depression Mother     Social History   Social History  . Marital status: Single    Spouse name: N/A  . Number of children: N/A  . Years of education: N/A   Occupational History  . Not on file.   Social History Main Topics  . Smoking status: Former Smoker    Packs/day: 1.00    Years: 1.00    Types: Cigarettes    Quit date: 05/10/1999  . Smokeless tobacco: Never Used  . Alcohol use 0.0 oz/week     Comment: on occasion  . Drug use: No  . Sexual activity: Yes   Other Topics Concern  . Not on file   Social History Narrative  . No narrative on file     Current Outpatient Prescriptions:  .  escitalopram (LEXAPRO) 20 MG tablet, Take 1 tablet (20 mg total) by mouth daily., Disp: 30 tablet, Rfl: 6 .  losartan (COZAAR) 25 MG tablet, TAKE 1/2 TABLET BY MOUTH EVERY DAY, Disp: 15 tablet, Rfl: 12 .  metFORMIN (GLUCOPHAGE) 500 MG tablet, Take 2 tablets in AM and 1 tab in PM, with a meal., Disp: 90 tablet, Rfl: 12 .  MITIGARE 0.6 MG CAPS, Take 0.6 mg by mouth daily. Take as directed., Disp: 30 capsule, Rfl: 0 .  ONETOUCH DELICA LANCETS 72I MISC, U UTD BID, Disp: , Rfl: 0  Allergies  Allergen Reactions  . Augmentin [Amoxicillin-Pot Clavulanate]     Sick on stomach  . Codeine      Review of Systems  Constitutional: Negative for chills, fever,  malaise/fatigue and weight loss.  HENT: Negative for hearing loss and tinnitus.   Eyes: Negative for blurred vision and double vision.  Respiratory: Positive for cough (secondary to "cold"). Negative for shortness of breath and wheezing.   Cardiovascular: Negative for chest pain, palpitations and leg swelling.  Gastrointestinal: Negative for abdominal pain, blood in stool and heartburn.  Skin: Negative for rash.  Neurological: Negative for dizziness, tingling, tremors, weakness and headaches.  Psychiatric/Behavioral: Positive for depression. The patient is nervous/anxious.       Objective  Vitals:   05/12/16 1610 05/12/16 1707  BP: (!) 146/85 130/80  Pulse: 92   Resp: 16   Temp: 98.9 F (37.2 C)   TempSrc: Oral   Weight: 247 lb (112 kg)   Height: _0  (1.676 m)     Physical Exam  Constitutional: She is oriented to person, place, and time and well-developed, well-nourished, and in no distress. No distress.  HENT:  Head: Normocephalic and atraumatic.  Eyes: Conjunctivae and EOM are normal. Pupils are equal, round, and reactive to light. No scleral icterus.  Neck: Normal range of motion. Neck supple. Carotid bruit is not present.  No thyromegaly present.  Cardiovascular: Normal rate, regular rhythm and normal heart sounds.  Exam reveals no gallop and no friction rub.   No murmur heard. Pulmonary/Chest: Effort normal and breath sounds normal. No respiratory distress. She has no wheezes. She has no rales.  Musculoskeletal: She exhibits no edema.  Lymphadenopathy:    She has no cervical adenopathy.  Neurological: She is alert and oriented to person, place, and time.  Psychiatric:  Affect is depressed and anxious.  Vitals reviewed.      Recent Results (from the past 2160 hour(s))  CBC with Differential     Status: None   Collection Time: 02/16/16 10:25 AM  Result Value Ref Range   WBC 9.4 3.8 - 10.8 K/uL   RBC 4.40 3.80 - 5.10 MIL/uL   Hemoglobin 12.5 11.7 - 15.5 g/dL    HCT 38.0 35.0 - 45.0 %   MCV 86.4 80.0 - 100.0 fL   MCH 28.4 27.0 - 33.0 pg   MCHC 32.9 32.0 - 36.0 g/dL   RDW 13.7 11.0 - 15.0 %   Platelets 316 140 - 400 K/uL   MPV 10.1 7.5 - 12.5 fL   Neutro Abs 5,828 1,500 - 7,800 cells/uL   Lymphs Abs 2,444 850 - 3,900 cells/uL   Monocytes Absolute 658 200 - 950 cells/uL   Eosinophils Absolute 470 15 - 500 cells/uL   Basophils Absolute 0 0 - 200 cells/uL   Neutrophils Relative % 62 %   Lymphocytes Relative 26 %   Monocytes Relative 7 %   Eosinophils Relative 5 %   Basophils Relative 0 %   Smear Review Criteria for review not met   Uric acid     Status: None   Collection Time: 02/16/16 10:25 AM  Result Value Ref Range   Uric Acid, Serum 5.9 2.5 - 7.0 mg/dL    Comment: Therapeutic target for gout patients: <6.0 mg/dL  BASIC METABOLIC PANEL WITH GFR     Status: Abnormal   Collection Time: 03/10/16 11:45 AM  Result Value Ref Range   Sodium 136 135 - 146 mmol/L   Potassium 4.2 3.5 - 5.3 mmol/L   Chloride 99 98 - 110 mmol/L   CO2 23 20 - 31 mmol/L   Glucose, Bld 258 (H) 65 - 99 mg/dL   BUN 9 7 - 25 mg/dL   Creat 0.70 0.50 - 1.10 mg/dL   Calcium 9.6 8.6 - 10.2 mg/dL   GFR, Est African American >89 >=60 mL/min   GFR, Est Non African American >89 >=60 mL/min     Assessment & Plan  Problem List Items Addressed This Visit      Endocrine   Diabetes ([place]) - Primary   Relevant Medications   metFORMIN (GLUCOPHAGE) 500 MG tablet   Other Relevant Orders   POCT HgB A1C   Controlled diabetes mellitus type 2 with complications (HCC)   Relevant Medications   metFORMIN (GLUCOPHAGE) 500 MG tablet     Other   Depression   Relevant Medications   escitalopram (LEXAPRO) 20 MG tablet      Meds ordered this encounter  Medications  . escitalopram (LEXAPRO) 20 MG tablet    Sig: Take 1 tablet (20 mg total) by mouth daily.    Dispense:  30 tablet    Refill:  6  . metFORMIN (GLUCOPHAGE) 500 MG tablet    Sig: Take 2 tablets in AM and 1 tab in  PM, with a meal.    Dispense:  90 tablet  Refill:  12   1. Type 2 diabetes mellitus without complication, without long-term current use of insulin (HCC)  - POCT HgB A1C-7.5 - metFORMIN (GLUCOPHAGE) 500 MG tablet; Take 2 tablets in AM and 1 tab in PM, with a meal.  Dispense: 90 tablet; Refill: 12  2. Other depression   3. Mild episode of recurrent major depressive disorder (HCC)  - escitalopram (LEXAPRO) 20 MG tablet; Take 1 tablet (20 mg total) by mouth daily.  Dispense: 30 tablet; Refill: 6- increased from 10 mg/d.  4. Controlled type 2 diabetes mellitus with complication, without long-term current use of insulin ([place])

## 2016-07-14 ENCOUNTER — Encounter: Payer: Self-pay | Admitting: Family Medicine

## 2016-07-14 ENCOUNTER — Ambulatory Visit (INDEPENDENT_AMBULATORY_CARE_PROVIDER_SITE_OTHER): Payer: 59 | Admitting: Family Medicine

## 2016-07-14 VITALS — BP 117/75 | HR 76 | Temp 98.3°F | Resp 16 | Ht 66.0 in | Wt 244.0 lb

## 2016-07-14 DIAGNOSIS — E119 Type 2 diabetes mellitus without complications: Secondary | ICD-10-CM | POA: Diagnosis not present

## 2016-07-14 DIAGNOSIS — F3289 Other specified depressive episodes: Secondary | ICD-10-CM

## 2016-07-14 DIAGNOSIS — M10061 Idiopathic gout, right knee: Secondary | ICD-10-CM | POA: Diagnosis not present

## 2016-07-14 NOTE — Progress Notes (Signed)
Name: Diana Cunningham   MRN: [id number]    DOB: 12-22-1980   Date:07/14/2016       Progress Note  Subjective  Chief Complaint  Chief Complaint  Patient presents with  . Diabetes  . Depression    HPI Here for f/u of DM and Depression.   BSs are improved on higher dose of Metformin (range 90-180).  Also depression doping MUCH better.  Feels  Back to old self again.  Feels 8/10 overall.  No problem-specific Assessment & Plan notes found for this encounter.   Past Medical History:  Diagnosis Date  . Pilonidal cyst     Past Surgical History:  Procedure Laterality Date  . PILONIDAL CYST EXCISION  2005    Family History  Problem Relation Age of Onset  . Hypertension Mother   . Depression Mother     Social History   Social History  . Marital status: Single    Spouse name: N/A  . Number of children: N/A  . Years of education: N/A   Occupational History  . Not on file.   Social History Main Topics  . Smoking status: Former Smoker    Packs/day: 1.00    Years: 1.00    Types: Cigarettes    Quit date: 05/10/1999  . Smokeless tobacco: Never Used  . Alcohol use 0.0 oz/week     Comment: on occasion  . Drug use: No  . Sexual activity: Yes   Other Topics Concern  . Not on file   Social History Narrative  . No narrative on file     Current Outpatient Prescriptions:  .  escitalopram (LEXAPRO) 20 MG tablet, Take 1 tablet (20 mg total) by mouth daily., Disp: 30 tablet, Rfl: 6 .  losartan (COZAAR) 25 MG tablet, TAKE 1/2 TABLET BY MOUTH EVERY DAY, Disp: 15 tablet, Rfl: 12 .  metFORMIN (GLUCOPHAGE) 500 MG tablet, Take 2 tablets in AM and 1 tab in PM, with a meal., Disp: 90 tablet, Rfl: 12 .  MITIGARE 0.6 MG CAPS, Take 0.6 mg by mouth daily. Take as directed., Disp: 30 capsule, Rfl: 0 .  ONETOUCH DELICA LANCETS 33K MISC, U UTD BID, Disp: , Rfl: 0  Allergies  Allergen Reactions  . Augmentin [Amoxicillin-Pot Clavulanate]     Sick on stomach  . Codeine      Review of  Systems  Constitutional: Negative for chills, fever, malaise/fatigue and weight loss.  HENT: Negative for hearing loss and tinnitus.   Eyes: Negative for blurred vision and double vision.  Respiratory: Negative for cough, shortness of breath and wheezing.   Cardiovascular: Negative for chest pain, palpitations and leg swelling.  Gastrointestinal: Negative for abdominal pain, blood in stool and heartburn.  Genitourinary: Negative for dysuria, frequency and urgency.  Musculoskeletal: Negative for back pain, joint pain and myalgias.  Skin: Negative for rash.  Neurological: Negative for dizziness, tingling, tremors, weakness and headaches.      Objective  Vitals:   07/14/16 0908  BP: 117/75  Pulse: 76  Resp: 16  Temp: 98.3 F (36.8 C)  TempSrc: Oral  Weight: 244 lb (110.7 kg)  Height: 5\' 6"  (1.676 m)    Physical Exam  Constitutional: She is well-developed, well-nourished, and in no distress. No distress.  HENT:  Head: Normocephalic and atraumatic.  Eyes: Conjunctivae and EOM are normal. Pupils are equal, round, and reactive to light. No scleral icterus.  Neck: Normal range of motion. Neck supple. Carotid bruit is not present. No thyromegaly present.  Cardiovascular: Normal  rate, regular rhythm and normal heart sounds.  Exam reveals no gallop and no friction rub.   No murmur heard. Pulmonary/Chest: Effort normal and breath sounds normal. No respiratory distress. She has no wheezes. She has no rales.  Abdominal: Soft. Bowel sounds are normal. She exhibits no distension. There is no tenderness. There is no rebound.  Musculoskeletal: She exhibits no edema.  Lymphadenopathy:    She has no cervical adenopathy.  Neurological: She is alert.  Vitals reviewed.      Recent Results (from the past 2160 hour(s))  POCT HgB A1C     Status: None   Collection Time: 05/12/16  5:18 PM  Result Value Ref Range   Hemoglobin A1C 7.5      Assessment & Plan  Problem List Items Addressed  This Visit      Endocrine   Diabetes ([place]) - Primary     Musculoskeletal and Integument   Acute gout of right knee     Other   Depression      No orders of the defined types were placed in this encounter.  1. Type 2 diabetes mellitus without complication, without long-term current use of insulin (HCC) Cont Metformin and Losartan  2. Other depression  Cont Lexapro 3. Acute idiopathic gout of right knee  Cont colchicine as needed.

## 2016-10-21 ENCOUNTER — Encounter: Payer: Self-pay | Admitting: Family Medicine

## 2016-10-21 ENCOUNTER — Ambulatory Visit (INDEPENDENT_AMBULATORY_CARE_PROVIDER_SITE_OTHER): Payer: 59 | Admitting: Family Medicine

## 2016-10-21 VITALS — BP 137/76 | HR 81 | Temp 98.3°F | Resp 16 | Ht 66.0 in | Wt 242.0 lb

## 2016-10-21 DIAGNOSIS — M1A0690 Idiopathic chronic gout, unspecified knee, without tophus (tophi): Secondary | ICD-10-CM | POA: Diagnosis not present

## 2016-10-21 DIAGNOSIS — F330 Major depressive disorder, recurrent, mild: Secondary | ICD-10-CM

## 2016-10-21 DIAGNOSIS — E119 Type 2 diabetes mellitus without complications: Secondary | ICD-10-CM

## 2016-10-21 LAB — POCT GLYCOSYLATED HEMOGLOBIN (HGB A1C): HEMOGLOBIN A1C: 7.5 — AB (ref ?–5.7)

## 2016-10-21 MED ORDER — ESCITALOPRAM OXALATE 20 MG PO TABS
20.0000 mg | ORAL_TABLET | Freq: Every day | ORAL | 11 refills | Status: DC
Start: 2016-10-21 — End: 2017-11-03

## 2016-10-21 MED ORDER — LOSARTAN POTASSIUM 25 MG PO TABS: 12.5000 mg | ORAL_TABLET | Freq: Every day | ORAL | 11 refills | Status: DC

## 2016-10-21 NOTE — Progress Notes (Signed)
Subjective:    Patient ID: Diana Cunningham, female    DOB: Apr 09, 1981, 36 y.o.   MRN: [id number]  Diana Cunningham is a 36 y.o. female presenting on 10/21/2016 for Diabetes   HPI   CHRONIC DM, Type 2: Reports no concerns. Past history diagnosed DM about 1-2 years ago. Recent review she had been followed by prior PCP q 3 months, had been making some adjustments to metformin, last visit 07/2016 increased from 500mg  BID to x 2 AM and 1 PM. She has never been on any other DM medications, and is not interested in any injectable medicine. CBGs: Avg 120-150, Low >100, High 180 (rare). Checks CBGs 1-2x every other day Meds: Metformin 1000mg  in AM (x2 500mg ) and 500mg  PM Reports good compliance. Tolerating well w/o side-effects Currently on ARB (Losartan half tab dose 12.5mg ) Lifestyle: - Weight down 5 lbs in 6 months - Diet (Tries to watch diet, low carbs, low sugar, ) - Exercise (Working on being more active, with husband doing some hiking) - Last ophthalmology exam 05/2016, [facility] Denies hypoglycemia, polyuria, visual changes, numbness or tingling.  DEPRESSION, Major - In Remission: Reviews history with initial symptoms of depressed mood onset about 1-2 years ago, around same time as her diagnosis of DM and other life stressors. She has been on Escitalopram for >6-12 months, varying doses started initially with low dose, and titrated up. Never on other medications. Never established with Psychiatry or Psychology therapy. Previously in 05/2016 she was increased from Escitalopram 10 to 20mg  daily - Today reports doing very well and much improved on Escitalopram 20mg  daily. Tolerating well without side effects - Feels more active, now she is getting out of house and doing more - Admits still insomnia occasionally - Denies suicidal or homicidal ideation, mania, anxiety, panic  Gout: - Last visit with me 03/2016 had flare in R Knee. She was treated with Colchicine, some difficulty due to cost. Her  uric acid was checked with 5.9. She was not started on any prophylaxis medications such as Allopurinol. - Today reports recently doing well, without any flares since 03/2016. Still has half bottle left of Colchicine 0.6mg  to take PRN flare only if needs.  Depression screen [facility] 2/9 10/21/2016 07/14/2016 02/16/2016  Decreased Interest 0 0 0  Down, Depressed, Hopeless 0 0 0  PHQ - 2 Score 0 0 0  Altered sleeping - - -  Tired, decreased energy - - -  Change in appetite - - -  Feeling bad or failure about yourself  - - -  Trouble concentrating - - -  Moving slowly or fidgety/restless - - -  Suicidal thoughts - - -  PHQ-9 Score - - -   No flowsheet data found.   Social History  Substance Use Topics  . Smoking status: Former Smoker    Packs/day: 1.00    Years: 1.00    Types: Cigarettes    Quit date: 05/10/1999  . Smokeless tobacco: Never Used  . Alcohol use 0.0 oz/week     Comment: on occasion    Review of Systems Per HPI unless specifically indicated above     Objective:    BP 137/76   Pulse 81   Temp 98.3 F (36.8 C) (Oral)   Resp 16   Ht 5\' 6"  (1.676 m)   Wt 242 lb (109.8 kg)   BMI 39.06 kg/m   Wt Readings from Last 3 Encounters:  10/21/16 242 lb (109.8 kg)  07/14/16 244 lb (110.7 kg)  05/12/16  247 lb (112 kg)    Physical Exam  Constitutional: She is oriented to person, place, and time. She appears well-developed and well-nourished. No distress.  Well-appearing, comfortable, cooperative, obese  HENT:  Head: Normocephalic and atraumatic.  Mouth/Throat: Oropharynx is clear and moist.  Eyes: Conjunctivae are normal.  Cardiovascular: Normal rate, regular rhythm, normal heart sounds and intact distal pulses.   No murmur heard. Pulmonary/Chest: Effort normal.  Musculoskeletal: She exhibits no edema.  Neurological: She is alert and oriented to person, place, and time.  Skin: Skin is warm and dry. No rash noted. She is not diaphoretic. No erythema.  Psychiatric: She has  a normal mood and affect. Her behavior is normal.  Well groomed, good eye contact, normal speech and thoughts  Nursing note and vitals reviewed.   Diabetic Foot Exam - Simple   Simple Foot Form Diabetic Foot exam was performed with the following findings:  Yes 10/21/2016  8:39 AM  Visual Inspection No deformities, no ulcerations, no other skin breakdown bilaterally:  Yes See comments:  Yes Sensation Testing Intact to touch and monofilament testing bilaterally:  Yes Pulse Check Posterior Tibialis and Dorsalis pulse intact bilaterally:  Yes Comments Mild callus formation on bilateral heels without ulceration.     Recent Labs  02/04/16 1117 05/12/16 1718 10/21/16 0838  HGBA1C 7.3 7.5 7.5*    Results for orders placed or performed in visit on 10/21/16  POCT HgB A1C  Result Value Ref Range   Hemoglobin A1C 7.5 (A) 5.7      Assessment & Plan:   Problem List Items Addressed This Visit    Gout    Stable without flare. Last flare 03/2016 R-Knee - No confirmation of joint fluid with crystals - Last uric acid level 5.9 (02/16/16)  Plan: 1. Continue lifestyle / dietary changes to help prevent flare 2. Has colchicine 0.6mg  to use PRN if flare, advised again on start immediately with flare, contact office, follow-up 24-48 hours, may need NSAID vs prednisone 3. Drinks alcohol every other week with beer, advised moderation avoid flare 4. Follow-up as needed - future discuss again allopurinol, may consider urgent referral to Ortho vs Rheum for joint aspiration crystal study if needed      Diabetes ([place]) - Primary    Stable, controlled DM with A1c 7.5 (unchanged in 5 months, previously better controlled mid 6s to low 7) No known complications or hypoglycemia.  Plan:  1. Increase Meformin to 1000mg  BID wc - had been inc from 1000mg  AM / 500mg  PM last visit 2. Encourage improved lifestyle - low carb, low sugar diet, reduce portion size, continue improving regular exercise 3.  Continue to monitor CBG occasionally, bring log to next visit for review 4. Continue ARB (not on ASA, Statin - need lipids discuss ASCVD risk, age only 59) 80. DM Foot exam done today Advised to schedule DM ophtho exam 05/2017, send record 6. Follow-up 3 months DM A1c - discussed future options for therapy SGLT2 (not interested in inc voiding) and GLP1 (not interested in injection)      Relevant Medications   losartan (COZAAR) 25 MG tablet   Other Relevant Orders   POCT HgB A1C (Completed)   Depression    Significantly improved. Controlled on current SSRI. Some underlying insomnia is secondary to mood. - PHQ9: 0 (prior 0s) - No prior medications (only dose inc on Escitalopram) - No prior Psych / counseling  Plan: 1. Discussion on duration of therapy with SSRI 2. Refilled Escitalopram 20mg  daily 3.  May benefit from counseling / therapy in future 4. Follow-up q 3-6 months PHQ      Relevant Medications   escitalopram (LEXAPRO) 20 MG tablet      Meds ordered this encounter  Medications  . Loratadine (CLARITIN PO)    Sig: Claritin  . escitalopram (LEXAPRO) 20 MG tablet    Sig: Take 1 tablet (20 mg total) by mouth daily.    Dispense:  30 tablet    Refill:  11  . losartan (COZAAR) 25 MG tablet    Sig: Take 0.5 tablets (12.5 mg total) by mouth daily.    Dispense:  15 tablet    Refill:  11    Follow up plan: Return in about 3 months (around 01/21/2017) for diabetes.  Nobie Putnam, [facility] 10/21/2016, 9:04 AM

## 2016-10-21 NOTE — Assessment & Plan Note (Signed)
Stable without flare. Last flare 03/2016 R-Knee - No confirmation of joint fluid with crystals - Last uric acid level 5.9 (02/16/16)  Plan: 1. Continue lifestyle / dietary changes to help prevent flare 2. Has colchicine 0.6mg  to use PRN if flare, advised again on start immediately with flare, contact office, follow-up 24-48 hours, may need NSAID vs prednisone 3. Drinks alcohol every other week with beer, advised moderation avoid flare 4. Follow-up as needed - future discuss again allopurinol, may consider urgent referral to Ortho vs Rheum for joint aspiration crystal study if needed

## 2016-10-21 NOTE — Assessment & Plan Note (Signed)
Stable, controlled DM with A1c 7.5 (unchanged in 5 months, previously better controlled mid 6s to low 7) No known complications or hypoglycemia.  Plan:  1. Increase Meformin to 1000mg  BID wc - had been inc from 1000mg  AM / 500mg  PM last visit 2. Encourage improved lifestyle - low carb, low sugar diet, reduce portion size, continue improving regular exercise 3. Continue to monitor CBG occasionally, bring log to next visit for review 4. Continue ARB (not on ASA, Statin - need lipids discuss ASCVD risk, age only 27) 66. DM Foot exam done today Advised to schedule DM ophtho exam 05/2017, send record 6. Follow-up 3 months DM A1c - discussed future options for therapy SGLT2 (not interested in inc voiding) and GLP1 (not interested in injection)

## 2016-10-21 NOTE — Patient Instructions (Addendum)
Thank you for coming to the clinic today.  1. Keep up the good work overall!  Increase Metformin to 1000mg  twice daily - let me know if you are ready for new rx for either the 1000mg  pills or MORE of the 500mg  pills for monthly supply.  Continue Escitalopram 20mg   Refilled Losartan, Escitalopram  Sleep Hygiene Recommendations to promote healthy sleep in all patients, especially if symptoms of insomnia are worsening. Due to the nature of sleep rhythms, if your body gets "out of rhythm", it may take some time before your sleep cycle can be "reset".  Please try to follow as many of the following tips as you can, usually there are only a few of these are the primary cause of the problem.  ?To reset your sleep rhythm, go to bed and get up at the same time every day ?Sleep only long enough to feel rested and then get out of bed ?Do not try to force yourself to sleep. If you can't sleep, get out of bed and try again later.  ?Have coffee, tea, and other foods that have caffeine only in the morning ?Exercise several days a week, but not right before bed ?If you drink alcohol, prefer to have appropriate drink with one meal, but prefer to avoid alcohol in the evening, and bedtime ?If you smoke, avoid smoking, especially in the evening  ?Avoid watching TV or looking at phones, computers, or reading devices ("e-books") that give off light at least 30 minutes before bed. This artificial light sends "awake signals" to your brain and can make it harder to fall asleep. ?Keep your bedroom dark, cool, quiet, and free of reminders of other things that cause you stress ?Try your best to solve or at least address your problems before you go to bed   Please schedule a Follow-up Appointment to: Return in about 3 months (around 01/21/2017) for diabetes.  If you have any other questions or concerns, please feel free to call the clinic or send a message through [place]. You may also schedule an earlier  appointment if necessary.  Additionally, you may be receiving a survey about your experience at our clinic within a few days to 1 week by e-mail or mail. We value your feedback.  Nobie Putnam, DO [place]

## 2016-10-21 NOTE — Assessment & Plan Note (Signed)
Significantly improved. Controlled on current SSRI. Some underlying insomnia is secondary to mood. - PHQ9: 0 (prior 0s) - No prior medications (only dose inc on Escitalopram) - No prior Psych / counseling  Plan: 1. Discussion on duration of therapy with SSRI 2. Refilled Escitalopram 20mg  daily 3. May benefit from counseling / therapy in future 4. Follow-up q 3-6 months PHQ

## 2016-10-24 ENCOUNTER — Encounter: Payer: Self-pay | Admitting: Family Medicine

## 2017-01-02 ENCOUNTER — Encounter: Payer: Self-pay | Admitting: Family Medicine

## 2017-01-02 ENCOUNTER — Ambulatory Visit (INDEPENDENT_AMBULATORY_CARE_PROVIDER_SITE_OTHER): Payer: BLUE CROSS/BLUE SHIELD | Admitting: Family Medicine

## 2017-01-02 VITALS — BP 119/74 | HR 78 | Temp 98.6°F | Resp 16 | Ht 66.0 in | Wt 236.0 lb

## 2017-01-02 DIAGNOSIS — H8111 Benign paroxysmal vertigo, right ear: Secondary | ICD-10-CM

## 2017-01-02 MED ORDER — MECLIZINE HCL 25 MG PO TABS: 25.0000 mg | ORAL_TABLET | Freq: Three times a day (TID) | ORAL | 0 refills | Status: DC | PRN

## 2017-01-02 NOTE — Patient Instructions (Addendum)
Thank you for coming to the clinic today.  1. 1. You have symptoms of Vertigo (Benign Paroxysmal Positional Vertigo) - This is commonly caused by inner ear fluid imbalance, sometimes can be worsened by allergies and sinus symptoms, otherwise it can occur randomly sometimes and we may never discover the exact cause. - To treat this, try the Epley Manuever (see diagrams/instructions below) at home up to 3 times a day for 1-2 weeks or until symptoms resolve - You may take Meclizine as needed up to 3 times a day for dizziness, this will not cure symptoms but may help. Caution may make you drowsy.  If you develop significant worsening episode with vertigo that does not improve and you get severe headache, loss of vision, arm or leg weakness, slurred speech, or other concerning symptoms please seek immediate medical attention at Emergency Department.  Please schedule a follow-up appointment with Dr Parks Ranger within 4 weeks if Vertigo not improving, and will consider Referral to Vestibular Rehab  See the next page for images describing the Epley Manuever.     ----------------------------------------------------------------------------------------------------------------------       Please schedule a Follow-up Appointment to: Return in about 4 weeks (around 01/30/2017), or if symptoms worsen or fail to improve, for Vertigo.  If you have any other questions or concerns, please feel free to call the clinic or send a message through [place]. You may also schedule an earlier appointment if necessary.  Additionally, you may be receiving a survey about your experience at our clinic within a few days to 1 week by e-mail or mail. We value your feedback.  Nobie Putnam, DO [place]

## 2017-01-02 NOTE — Progress Notes (Signed)
Subjective:    Patient ID: [name], female    DOB: 13-Sep-1980, 36 y.o.   MRN: [id number]  Eisha Chatterjee is a 36 y.o. female presenting on 01/02/2017 for Dizziness (blurred vision revolving pictures and ear pain onset today pt checked BS was 119 )  Patient presents for a same day appointment. Accompanied by mother.  HPI   DIZZINESS / VERTIGO: Reports that she woke up this morning around 0300 and she "rolled over" and had acute onset dizziness and vertigo with room spinning, difficulty with balance and walking straight. If resting, still has some dizziness but vision with room spinning and vertigo as resolved. If she moves too quickly then dizziness and spinning returns. - Not tried any medicines - No prior history of recent illness or similar symptoms in past, no prior vertigo - Family history with mother having similar vertigo flares in past, hers resolved with Meclizine - Admits Right ear pain and pressure - Denies any fevers/chills, sinus congestion, cough, nausea vomiting, numbness, weakness, tingling  Social History  Substance Use Topics  . Smoking status: Former Smoker    Packs/day: 1.00    Years: 1.00    Types: Cigarettes    Quit date: 05/10/1999  . Smokeless tobacco: Never Used  . Alcohol use 0.0 oz/week     Comment: on occasion    Review of Systems Per HPI unless specifically indicated above     Objective:    BP 119/74   Pulse 78   Temp 98.6 F (37 C) (Oral)   Resp 16   Ht 5\' 6"  (1.676 m)   Wt 236 lb (107 kg)   BMI 38.09 kg/m   Wt Readings from Last 3 Encounters:  01/02/17 236 lb (107 kg)  10/21/16 242 lb (109.8 kg)  07/14/16 244 lb (110.7 kg)    Physical Exam  Constitutional: She is oriented to person, place, and time. She appears well-developed and well-nourished. No distress.  Well-appearing, comfortable, cooperative  HENT:  Head: Normocephalic and atraumatic.  Mouth/Throat: Oropharynx is clear and moist.  Frontal / maxillary sinuses  non-tender. Nares patent without purulence or edema. Bilateral TMs clear without erythema, effusion or bulging. Oropharynx clear without erythema, exudates, edema or asymmetry.  Dix-Hallpike Maneuver performed R side with grossly positive results with reproduced vertigo after 5-10 second of maneuver. Did not attempt Left sided test.  Eyes: Conjunctivae are normal. Right eye exhibits no discharge. Left eye exhibits no discharge.  Neck: Normal range of motion. Neck supple. No thyromegaly present.  Cardiovascular: Normal rate, regular rhythm, normal heart sounds and intact distal pulses.   No murmur heard. Pulmonary/Chest: Effort normal and breath sounds normal. No respiratory distress. She has no wheezes. She has no rales.  Musculoskeletal: Normal range of motion. She exhibits no edema.  Lymphadenopathy:    She has no cervical adenopathy.  Neurological: She is alert and oriented to person, place, and time.  Skin: Skin is warm and dry. No rash noted. She is not diaphoretic. No erythema.  Psychiatric: She has a normal mood and affect. Her behavior is normal.  Well groomed, good eye contact, normal speech and thoughts  Nursing note and vitals reviewed.  Results for orders placed or performed in visit on 10/21/16  POCT HgB A1C  Result Value Ref Range   Hemoglobin A1C 7.5 (A) 5.7      Assessment & Plan:   Problem List Items Addressed This Visit    None    Visit Diagnoses    Benign paroxysmal  positional vertigo of right ear    -  Primary  Suspected R-BPPV by history supported by positive R Dix-Hallpike - No other significant neurological findings or focal deficits  Plan: 1. Handout given with Epley maneuver TID for 1-2 weeks until resolved 2. Rx meclizine 25mg  TID PRN for breakthrough symptoms 3. Return criteria, if not improved consider vestibular PT referral    Relevant Medications   meclizine (ANTIVERT) 25 MG tablet      Meds ordered this encounter  Medications  . meclizine  (ANTIVERT) 25 MG tablet    Sig: Take 1 tablet (25 mg total) by mouth 3 (three) times daily as needed for dizziness.    Dispense:  30 tablet    Refill:  0    Follow up plan: Return in about 4 weeks (around 01/30/2017), or if symptoms worsen or fail to improve, for Vertigo.  Nobie Putnam, [facility] 01/02/2017, 3:20 PM

## 2017-01-27 ENCOUNTER — Ambulatory Visit (INDEPENDENT_AMBULATORY_CARE_PROVIDER_SITE_OTHER): Payer: BLUE CROSS/BLUE SHIELD | Admitting: Family Medicine

## 2017-01-27 ENCOUNTER — Other Ambulatory Visit: Payer: Self-pay | Admitting: Family Medicine

## 2017-01-27 ENCOUNTER — Encounter: Payer: Self-pay | Admitting: Family Medicine

## 2017-01-27 VITALS — BP 124/80 | HR 75 | Temp 98.5°F | Resp 16 | Ht 66.0 in | Wt 239.0 lb

## 2017-01-27 DIAGNOSIS — R635 Abnormal weight gain: Secondary | ICD-10-CM

## 2017-01-27 DIAGNOSIS — Z23 Encounter for immunization: Secondary | ICD-10-CM

## 2017-01-27 DIAGNOSIS — E119 Type 2 diabetes mellitus without complications: Secondary | ICD-10-CM

## 2017-01-27 DIAGNOSIS — E669 Obesity, unspecified: Secondary | ICD-10-CM | POA: Insufficient documentation

## 2017-01-27 DIAGNOSIS — H8111 Benign paroxysmal vertigo, right ear: Secondary | ICD-10-CM | POA: Diagnosis not present

## 2017-01-27 DIAGNOSIS — Z0000 Encounter for general adult medical examination without abnormal findings: Secondary | ICD-10-CM

## 2017-01-27 LAB — POCT GLYCOSYLATED HEMOGLOBIN (HGB A1C): HEMOGLOBIN A1C: 7 — AB (ref ?–5.7)

## 2017-01-27 MED ORDER — METFORMIN HCL 500 MG PO TABS: 1000.0000 mg | ORAL_TABLET | Freq: Two times a day (BID) | ORAL | 11 refills | Status: DC

## 2017-01-27 MED ORDER — MECLIZINE HCL 25 MG PO TABS: 25.0000 mg | ORAL_TABLET | Freq: Three times a day (TID) | ORAL | 0 refills | Status: DC | PRN

## 2017-01-27 NOTE — Assessment & Plan Note (Signed)
Improved control DM with A1c 7.0 from 7.0-1.7-7.9 No known complications or hypoglycemia.  Plan:  1. Continue increased Metformin 1000mg  BID 2. Encourage improved lifestyle - low carb, low sugar diet, reduce portion size, continue improving regular exercise - increase yoga, walking 3. Continue to monitor CBG occasionally, bring log to next visit for review 4. Continue ARB (not on ASA, Statin - need lipids at next visit discuss ASCVD risk, age only 71) 53. Due DM Eye 05/2017 6. Follow-up 3 months annual phys + labs A1c - Future consider GLP1 if inc A1c, has high deductible ins plan, high cost, but if approved may benefit from manufacturer coupon card for copay

## 2017-01-27 NOTE — Progress Notes (Signed)
Subjective:    Patient ID: Diana Cunningham, female    DOB: Oct 15, 1980, 36 y.o.   MRN: [id number]  Diana Cunningham is a 36 y.o. female presenting on 01/27/2017 for Diabetes   HPI   CHRONIC DM, Type 2: - Last visit with me 10/21/16, for same problem, treated with increased Metformin from 1000mg  AM / 500mg  PM to 1000mg  BID, see prior notes for background information. - Interval update with tolerating inc metformin well, but less active and limited exercise over past 2 months due to vertigo and dizziness at times, has not resume this. - Today patient reports overall thinks blood sugar is improved, but needs to get back to exercie CBGs: Avg 110-130, Low >100, High 150 (rare). Checks CBGs 1-2x every other day Meds: Metformin 1000mg  BID Reports good compliance. Tolerating well w/o side-effects Currently on ARB (Losartan half tab dose 12.5mg ) Lifestyle: - Weight 3 lbs in 3 months, some fluctuating wt gain and loss - Diet (No changes, still trying low carb diet, admits eats pumpkin spice / peppermint seasonal flavored foods) - Exercise (Less exercise in 2-3 months, with vertigo/dizzy, now gradually resuming yoga, bought treadmill at home will start working out more) - Last ophthalmology exam 05/2016, [facility] Denies hypoglycemia, polyuria, visual changes, numbness or tingling.  FOLLOW-UP BPPV, R - Last visit with me 01/02/17, for initial visit for same problem, treated with Epley Maneuver at home and Meclizine PRN, see prior notes for background information. - Interval update with notable improvement with Epley maneuver, now no longer using it, stopped taking Meclizine - Today patient reports still has occasional symptoms, minimal with nausea but not nearly as severe - Reques refill meclizine just in case - Denies dizziness, nausea vomiting, headache, vision changes, numbness tingling  Health Maintenance: - Due for Flu Shot, will get today  Depression screen [facility] 2/9 01/27/2017 10/21/2016  07/14/2016  Decreased Interest 0 0 0  Down, Depressed, Hopeless 0 0 0  PHQ - 2 Score 0 0 0  Altered sleeping 2 - -  Tired, decreased energy 1 - -  Change in appetite 0 - -  Feeling bad or failure about yourself  0 - -  Trouble concentrating 0 - -  Moving slowly or fidgety/restless 0 - -  Suicidal thoughts 0 - -  PHQ-9 Score 3 - -  Difficult doing work/chores Somewhat difficult - -    Social History  Substance Use Topics  . Smoking status: Former Smoker    Packs/day: 1.00    Years: 1.00    Types: Cigarettes    Quit date: 05/10/1999  . Smokeless tobacco: Former Systems developer  . Alcohol use 0.0 oz/week     Comment: on occasion    Review of Systems Per HPI unless specifically indicated above     Objective:    BP 124/80   Pulse 75   Temp 98.5 F (36.9 C) (Oral)   Resp 16   Ht 5\' 6"  (1.676 m)   Wt 239 lb (108.4 kg)   BMI 38.58 kg/m   Wt Readings from Last 3 Encounters:  01/27/17 239 lb (108.4 kg)  01/02/17 236 lb (107 kg)  10/21/16 242 lb (109.8 kg)    Physical Exam  Constitutional: She is oriented to person, place, and time. She appears well-developed and well-nourished. No distress.  Well-appearing, comfortable, cooperative, obese  HENT:  Head: Normocephalic and atraumatic.  Mouth/Throat: Oropharynx is clear and moist.  Eyes: Conjunctivae are normal.  Cardiovascular: Normal rate, regular rhythm, normal heart sounds and intact  distal pulses.   No murmur heard. Pulmonary/Chest: Effort normal and breath sounds normal. No respiratory distress. She has no wheezes. She has no rales.  Musculoskeletal: Normal range of motion. She exhibits no edema.  Neurological: She is alert and oriented to person, place, and time.  Skin: Skin is warm and dry. No rash noted. She is not diaphoretic. No erythema.  Psychiatric: She has a normal mood and affect. Her behavior is normal.  Well groomed, good eye contact, normal speech and thoughts  Nursing note and vitals reviewed.  Results for  orders placed or performed in visit on 01/27/17  POCT HgB A1C  Result Value Ref Range   Hemoglobin A1C 7.0 (A) 5.7      Assessment & Plan:   Problem List Items Addressed This Visit    Type 2 diabetes mellitus without complication ([place]) - Primary    Improved control DM with A1c 7.0 from 3.8-1.7-7.1 No known complications or hypoglycemia.  Plan:  1. Continue increased Metformin 1000mg  BID 2. Encourage improved lifestyle - low carb, low sugar diet, reduce portion size, continue improving regular exercise - increase yoga, walking 3. Continue to monitor CBG occasionally, bring log to next visit for review 4. Continue ARB (not on ASA, Statin - need lipids at next visit discuss ASCVD risk, age only 16) 49. Due DM Eye 05/2017 6. Follow-up 3 months annual phys + labs A1c - Future consider GLP1 if inc A1c, has high deductible ins plan, high cost, but if approved may benefit from manufacturer coupon card for copay      Relevant Medications   metFORMIN (GLUCOPHAGE) 500 MG tablet   Other Relevant Orders   POCT HgB A1C (Completed)    Other Visit Diagnoses    Benign paroxysmal positional vertigo of right ear       Mostly resolved. Mild residual episodes of dizziness, may take Meclizine PRN, follow-up if worse, consider more specialized vestibular rehab treatment if need   Relevant Medications   meclizine (ANTIVERT) 25 MG tablet      Meds ordered this encounter  Medications  . metFORMIN (GLUCOPHAGE) 500 MG tablet    Sig: Take 2 tablets (1,000 mg total) by mouth 2 (two) times daily with a meal.    Dispense:  120 tablet    Refill:  11  . meclizine (ANTIVERT) 25 MG tablet    Sig: Take 1 tablet (25 mg total) by mouth 3 (three) times daily as needed for dizziness.    Dispense:  30 tablet    Refill:  0    Follow up plan: Return in about 3 months (around 04/28/2017) for Annual Physical.  Nobie Putnam, DO [facility] 01/27/2017, 8:59 AM

## 2017-01-27 NOTE — Patient Instructions (Addendum)
Thank you for coming to the clinic today.  1. Keep up the great work!  2. Continue same dose Metformin. Refilled for 4 pills a day  3. Refilled Meclizine, if vertigo is significantly worsening again, notify me and we can arrange vestibular rehab  DUE for FASTING BLOOD WORK (no food or drink after midnight before the lab appointment, only water or coffee without cream/sugar on the morning of)  SCHEDULE "Lab Only" visit in the morning at the clinic for lab draw in 3 MONTHS  - Make sure Lab Only appointment is at about 1 week before your next appointment, so that results will be available  For Lab Results, once available within 2-3 days of blood draw, you can can log in to MyChart online to view your results and a brief explanation. Also, we can discuss results at next follow-up visit.  Please schedule a Follow-up Appointment to: Return in about 3 months (around 04/28/2017) for Annual Physical.  If you have any other questions or concerns, please feel free to call the clinic or send a message through [place]. You may also schedule an earlier appointment if necessary.  Additionally, you may be receiving a survey about your experience at our clinic within a few days to 1 week by e-mail or mail. We value your feedback.  Nobie Putnam, DO [place]

## 2017-04-21 ENCOUNTER — Other Ambulatory Visit: Payer: BLUE CROSS/BLUE SHIELD

## 2017-04-28 ENCOUNTER — Encounter: Payer: BLUE CROSS/BLUE SHIELD | Admitting: Family Medicine

## 2017-05-19 ENCOUNTER — Other Ambulatory Visit: Payer: BLUE CROSS/BLUE SHIELD

## 2017-05-19 DIAGNOSIS — E119 Type 2 diabetes mellitus without complications: Secondary | ICD-10-CM

## 2017-05-19 DIAGNOSIS — E669 Obesity, unspecified: Secondary | ICD-10-CM

## 2017-05-19 DIAGNOSIS — Z0000 Encounter for general adult medical examination without abnormal findings: Secondary | ICD-10-CM | POA: Diagnosis not present

## 2017-05-19 DIAGNOSIS — R635 Abnormal weight gain: Secondary | ICD-10-CM

## 2017-05-20 LAB — CBC WITH DIFFERENTIAL/PLATELET
BASOS ABS: 66 {cells}/uL (ref 0–200)
Basophils Relative: 0.8 %
Eosinophils Absolute: 490 cells/uL (ref 15–500)
Eosinophils Relative: 5.9 %
HEMATOCRIT: 36.4 % (ref 35.0–45.0)
Hemoglobin: 12.2 g/dL (ref 11.7–15.5)
Lymphs Abs: 2042 cells/uL (ref 850–3900)
MCH: 28.8 pg (ref 27.0–33.0)
MCHC: 33.5 g/dL (ref 32.0–36.0)
MCV: 85.8 fL (ref 80.0–100.0)
MPV: 10.9 fL (ref 7.5–12.5)
Monocytes Relative: 5.9 %
NEUTROS PCT: 62.8 %
Neutro Abs: 5212 cells/uL (ref 1500–7800)
PLATELETS: 260 10*3/uL (ref 140–400)
RBC: 4.24 10*6/uL (ref 3.80–5.10)
RDW: 13.1 % (ref 11.0–15.0)
TOTAL LYMPHOCYTE: 24.6 %
WBC: 8.3 10*3/uL (ref 3.8–10.8)
WBCMIX: 490 {cells}/uL (ref 200–950)

## 2017-05-20 LAB — HEMOGLOBIN A1C
Hgb A1c MFr Bld: 7.5 % of total Hgb — ABNORMAL HIGH (ref <5.7)
Mean Plasma Glucose: 169 (calc)
eAG (mmol/L): 9.3 (calc)

## 2017-05-20 LAB — COMPLETE METABOLIC PANEL WITH GFR
AG Ratio: 1.4 (calc) (ref 1.0–2.5)
ALKALINE PHOSPHATASE (APISO): 90 U/L (ref 33–115)
ALT: 27 U/L (ref 6–29)
AST: 17 U/L (ref 10–30)
Albumin: 4.1 g/dL (ref 3.6–5.1)
BUN: 9 mg/dL (ref 7–25)
CALCIUM: 8.9 mg/dL (ref 8.6–10.2)
CO2: 26 mmol/L (ref 20–32)
CREATININE: 0.52 mg/dL (ref 0.50–1.10)
Chloride: 100 mmol/L (ref 98–110)
GFR, EST NON AFRICAN AMERICAN: 123 mL/min/{1.73_m2} (ref >=60)
GFR, Est African American: 142 mL/min/{1.73_m2} (ref >=60)
Globulin: 2.9 g/dL (calc) (ref 1.9–3.7)
Glucose, Bld: 193 mg/dL — ABNORMAL HIGH (ref 65–99)
POTASSIUM: 4.1 mmol/L (ref 3.5–5.3)
SODIUM: 137 mmol/L (ref 135–146)
Total Bilirubin: 0.3 mg/dL (ref 0.2–1.2)
Total Protein: 7 g/dL (ref 6.1–8.1)

## 2017-05-20 LAB — TSH: TSH: 1.82 mIU/L

## 2017-05-20 LAB — LIPID PANEL
CHOL/HDL RATIO: 4.1 (calc) (ref <5.0)
Cholesterol: 174 mg/dL (ref <200)
HDL: 42 mg/dL — AB (ref >50)
LDL CHOLESTEROL (CALC): 97 mg/dL
NON-HDL CHOLESTEROL (CALC): 132 mg/dL — AB (ref <130)
TRIGLYCERIDES: 231 mg/dL — AB (ref <150)

## 2017-05-26 ENCOUNTER — Encounter: Payer: BLUE CROSS/BLUE SHIELD | Admitting: Family Medicine

## 2017-09-22 LAB — HM DIABETES EYE EXAM

## 2017-10-24 ENCOUNTER — Other Ambulatory Visit: Payer: Self-pay | Admitting: Family Medicine

## 2017-10-24 DIAGNOSIS — E119 Type 2 diabetes mellitus without complications: Secondary | ICD-10-CM

## 2017-11-03 ENCOUNTER — Ambulatory Visit (INDEPENDENT_AMBULATORY_CARE_PROVIDER_SITE_OTHER): Payer: BLUE CROSS/BLUE SHIELD | Admitting: Family Medicine

## 2017-11-03 ENCOUNTER — Encounter: Payer: Self-pay | Admitting: Family Medicine

## 2017-11-03 VITALS — BP 124/86 | HR 91 | Temp 98.4°F | Resp 16 | Ht 66.0 in | Wt 237.0 lb

## 2017-11-03 DIAGNOSIS — F3341 Major depressive disorder, recurrent, in partial remission: Secondary | ICD-10-CM | POA: Diagnosis not present

## 2017-11-03 DIAGNOSIS — E1169 Type 2 diabetes mellitus with other specified complication: Secondary | ICD-10-CM

## 2017-11-03 LAB — POCT GLYCOSYLATED HEMOGLOBIN (HGB A1C): Hemoglobin A1C: 10.1 % — AB (ref 4.0–5.6)

## 2017-11-03 MED ORDER — ESCITALOPRAM OXALATE 20 MG PO TABS: 20.0000 mg | ORAL_TABLET | Freq: Every day | ORAL | 11 refills | Status: DC

## 2017-11-03 NOTE — Assessment & Plan Note (Signed)
Clinically with morbid obesity despite BMI >38 - with co morbid conditions Type 2 Diabetes, Hyperlipidemia, and Depression - Encourage lifestyle improvement - continue diet and exercise regimen already with some weight loss - Treat DM with now worsening A1c - offer future GLP1 agent for Dm and Wt loss

## 2017-11-03 NOTE — Progress Notes (Signed)
Subjective:    Patient ID: Diana Cunningham, female    DOB: 1981/01/22, 37 y.o.   MRN: [id number]  Diana Cunningham is a 37 y.o. female presenting on 11/03/2017 for Diabetes (highest 181 and lowest 116)   HPI   CHRONIC DM, Type 2 / Morbid Obesity BMI >38 w/ comorbidities - Last visit with me 01/2017, for same problem, see prior notes for background information. - Today patient reports overall concern about blood sugar. She admits missed last apt 05/2017 for her yearly and labs CBGs: Avg 130-150, Low >116, High 181. Checks CBGs 1-2x every day Meds: Metformin 1068m BID Reports good compliance. Tolerating well w/o side-effects Currently on ARB (Losartan half tab dose 12.553m Lifestyle: - Weight down 8 lbs in 1 month now by her scales - Diet (Recent diet overhaul now with husband also changing diet, 1 month ago started Paleo Diet (80% of time) and overall has had significant improvement - no longer eating processed foods, eats higher protein, still some carb but lower - already gave up soda and fast food - dramatically increased water, and replaced coffee with green tea) - Exercise (Improved yoga for exercise again, helps her mood as well) - Last ophthalmology exam, [facility] Dr RoLanny Hurst/2019 - already done, we are waiting on report. No reported DM retinopathy. No glasses change either Denies hypoglycemia, polyuria, visual changes, numbness or tingling.  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 Good for 3-5 years, she is interested for repeat pap smear from GYN, she will consider referral for 05/2018   [facility] 11/03/2017 01/27/2017 10/21/2016  Decreased Interest 0 0 0  Down, Depressed, Hopeless 0 0 0  PHQ - 2 Score 0 0 0  Altered sleeping 1 2 -  Tired, decreased energy 0 1 -  Change in appetite 0 0 -  Feeling bad or failure about yourself  1 0 -  Trouble concentrating 0 0 -  Moving slowly or fidgety/restless 2 0 -  Suicidal thoughts 0 0 -  PHQ-9 Score 4 3 -  Difficult doing work/chores Not difficult at all Somewhat difficult -    Social History   Tobacco Use  . Smoking status: Former Smoker    Packs/day: 1.00    Years: 1.00    Pack years: 1.00    Types: Cigarettes    Last attempt to quit: 05/10/1999    Years since quitting: 18.4  . Smokeless tobacco: Former UseNetwork engineere Topics  . Alcohol use: Yes    Alcohol/week: 0.0 oz    Comment: on occasion  . Drug  use: No    Review of Systems Per HPI unless specifically indicated above     Objective:    BP 124/86   Pulse 91   Temp 98.4 F (36.9 C) (Oral)   Resp 16   Ht '5\' 6"'  (1.676 m)   Wt 237 lb (107.5 kg)   BMI 38.25 kg/m   Wt Readings from Last 3 Encounters:  11/03/17 237 lb (107.5 kg)  01/27/17 239 lb (108.4 kg)  01/02/17 236 lb (107 kg)    Physical Exam  Constitutional: She is oriented to person, place, and time. She appears well-developed and well-nourished. No distress.  Well-appearing, comfortable, cooperative, obese  HENT:    Head: Normocephalic and atraumatic.  Mouth/Throat: Oropharynx is clear and moist.  Frontal / maxillary sinuses non-tender. Nares patent without purulence or edema. Bilateral TMs with very minimal clear to opaque effusion without erythema or bulging. Oropharynx clear without erythema, exudates, edema or asymmetry.  Eyes: Conjunctivae are normal.  Neck: Neck supple.  Cardiovascular: Normal rate and intact distal pulses.  Pulmonary/Chest: Breath sounds normal.  Musculoskeletal: Normal range of motion. She exhibits no edema.  Bilateral Feet No obvious deformity. See Foot exam.  Neurological: She is alert and oriented to person, place, and time.  Skin: Skin is warm and dry. No rash noted. She is not diaphoretic. No erythema.  Psychiatric: She has a normal mood and affect. Her behavior is normal.  Well groomed, good eye contact, normal speech and thoughts  Nursing note and vitals reviewed.    Diabetic Foot Exam - Simple   Simple Foot Form Diabetic Foot exam was performed with the following findings:  Yes 11/03/2017  2:17 PM  Visual Inspection See comments:  Yes Sensation Testing Intact to touch and monofilament testing bilaterally:  Yes Pulse Check Posterior Tibialis and Dorsalis pulse intact bilaterally:  Yes Comments Bilateral callus formation heels and forefoot. No ulceration. Intact sensation to monofilament bilateral. No obvious hallux deformity, not consistent with bunion, some slight deviation of great toes at middle joint.    Recent Labs    01/27/17 0850 05/19/17 0805 11/03/17 1436  HGBA1C 7.0* 7.5* 10.1*    Results for orders placed or performed in visit on 11/03/17  HM PAP SMEAR  Result Value Ref Range   HM Pap smear      Negative intraepithelial lesion and malignancy / Negative high risk HPV  POCT HgB A1C  Result Value Ref Range   Hemoglobin A1C 10.1 (A) 4.0 - 5.6 %   HbA1c POC (<> result, manual entry)  4.0 - 5.6 %   HbA1c, POC (prediabetic range)  5.7 - 6.4 %    HbA1c, POC (controlled diabetic range)  0.0 - 7.0 %      Assessment & Plan:   Problem List Items Addressed This Visit    Morbid obesity ([place])    Clinically with morbid obesity despite BMI >38 - with co morbid conditions Type 2 Diabetes, Hyperlipidemia, and Depression - Encourage lifestyle improvement - continue diet and exercise regimen already with some weight loss - Treat DM with now worsening A1c - offer future GLP1 agent for Dm and Wt loss      Recurrent major depression in partial remission (HCC)    Stable, controlled mood on SSRI Some underlying insomnia and tired, remains stable PHQ 4, from prior 0 No current psychiatry  Plan Refill current Escitalopram 27m daily - continue current therapy may adjust in future if need Follow-up      Relevant Medications  escitalopram (LEXAPRO) 20 MG tablet   Type 2 diabetes mellitus with other specified complication ([place]) - Primary    Worsening now uncontrolled DM with hyperglycemia, A1c 10.1 up from 7.5 - attributed to diet/lifestyle Complications - other including hyperlipidemia, depression, morbid obesity - increases risk of future cardiovascular complications and poor glucose control due to reduced lifestyle diet/exercise with low energy mood and fatigue  Plan:  - Discussion on concern with higher A1c and worsening trend - also advised would recommend repeat with lab draw A1c she declines this due to blood draw - offered new rx medication as next option - discussion of GLP1 medications, she will need to consider this, in past she tried to pursue but limited due to cost with high deductible ins plan 1. Continue current therapy - Metformin 1074m BID 2. Encourage improved lifestyle - may continue paleo diet but emphasis on low carb, low sugar diet, reduce portion size, continue improving regular exercise 3. Check CBG, bring log to next visit for review 4. Continue ARB 5. DM Foot exam done today / Advised need copy of DM ophtho exam,  send record - [facility] [place] Dr RHarle Stanford- will call to request record 6. Follow-up 3 months DM A1c - med adjust       Relevant Medications   Blood Glucose Monitoring Suppl (ONE TOUCH ULTRA 2) w/Device KIT   Other Relevant Orders   POCT HgB A1C (Completed)      # Reduced Hearing - mild, conversational Normal structural otoscopic exam except very minimal opaque appearance vs mild effusion TMs Check hearing screen in office. Normal result. Follow-up if not improve - future audiology referral if interested   Meds ordered this encounter  Medications  . escitalopram (LEXAPRO) 20 MG tablet    Sig: Take 1 tablet (20 mg total) by mouth daily.    Dispense:  30 tablet    Refill:  11    Follow up plan: Return in about 3 months (around 02/03/2018) for DM A1c.   ANobie Putnam [facility] 11/03/2017, 2:59 PM

## 2017-11-03 NOTE — Assessment & Plan Note (Signed)
Worsening now uncontrolled DM with hyperglycemia, A1c 10.1 up from 7.5 - attributed to diet/lifestyle Complications - other including hyperlipidemia, depression, morbid obesity - increases risk of future cardiovascular complications and poor glucose control due to reduced lifestyle diet/exercise with low energy mood and fatigue  Plan:  - Discussion on concern with higher A1c and worsening trend - also advised would recommend repeat with lab draw A1c she declines this due to blood draw - offered new rx medication as next option - discussion of GLP1 medications, she will need to consider this, in past she tried to pursue but limited due to cost with high deductible ins plan 1. Continue current therapy - Metformin 1000mg  BID 2. Encourage improved lifestyle - may continue paleo diet but emphasis on low carb, low sugar diet, reduce portion size, continue improving regular exercise 3. Check CBG, bring log to next visit for review 4. Continue ARB 5. DM Foot exam done today / Advised need copy of DM ophtho exam, send record - [facility] [place] Dr Harle Stanford - will call to request record 6. Follow-up 3 months DM A1c - med adjust

## 2017-11-03 NOTE — Assessment & Plan Note (Signed)
Stable, controlled mood on SSRI Some underlying insomnia and tired, remains stable PHQ 4, from prior 0 No current psychiatry  Plan Refill current Escitalopram 20mg  daily - continue current therapy may adjust in future if need Follow-up

## 2017-11-03 NOTE — Patient Instructions (Addendum)
Thank you for coming to the office today.  A1c elevated today up to 10.1, this may be falsely elevated due to fingerstick test  We will contact [place] for your Diabetic Eye report  I do not think you have bunions at this time. Continue to watch this.  Routine hearing screen was normal today - if worsening let me know we can refer to Audiology  Consider GYN for next pap smear within 1-2 years. Last test result was 06/2014 - negative for malignancy and negative high risk HPV  [facility] [address] [place], [place] [address] Hours: Nena Pol[place] Main: [place]   Address: [address], [place], [place], [place], [place] [address] Hours: 8AM-5PM Phone: [phone]  Please schedule a Follow-up Appointment to: Return in about 3 months (around 02/03/2018) for DM A1c.  If you have any other questions or concerns, please feel free to call the office or send a message through [place]. You may also schedule an earlier appointment if necessary.  Additionally, you may be receiving a survey about your experience at our office within a few days to 1 week by e-mail or mail. We value your feedback.  Nobie Putnam, DO [place]

## 2017-12-05 ENCOUNTER — Encounter: Payer: Self-pay | Admitting: Family Medicine

## 2017-12-05 DIAGNOSIS — E1169 Type 2 diabetes mellitus with other specified complication: Secondary | ICD-10-CM

## 2017-12-05 MED ORDER — INSULIN PEN NEEDLE 32G X 4 MM MISC: 3 refills | Status: DC

## 2017-12-05 MED ORDER — SEMAGLUTIDE(0.25 OR 0.5MG/DOS) 2 MG/1.5ML ~~LOC~~ SOPN: 0.5000 mg | PEN_INJECTOR | SUBCUTANEOUS | 2 refills | Status: DC

## 2018-02-09 ENCOUNTER — Other Ambulatory Visit: Payer: Self-pay | Admitting: Family Medicine

## 2018-02-09 ENCOUNTER — Encounter: Payer: Self-pay | Admitting: Family Medicine

## 2018-02-09 ENCOUNTER — Ambulatory Visit (INDEPENDENT_AMBULATORY_CARE_PROVIDER_SITE_OTHER): Payer: BLUE CROSS/BLUE SHIELD | Admitting: Family Medicine

## 2018-02-09 VITALS — BP 126/80 | HR 91 | Temp 98.4°F | Resp 16 | Ht 66.0 in | Wt 240.0 lb

## 2018-02-09 DIAGNOSIS — M1A0690 Idiopathic chronic gout, unspecified knee, without tophus (tophi): Secondary | ICD-10-CM

## 2018-02-09 DIAGNOSIS — Z0000 Encounter for general adult medical examination without abnormal findings: Secondary | ICD-10-CM

## 2018-02-09 DIAGNOSIS — F3341 Major depressive disorder, recurrent, in partial remission: Secondary | ICD-10-CM

## 2018-02-09 DIAGNOSIS — E1169 Type 2 diabetes mellitus with other specified complication: Secondary | ICD-10-CM

## 2018-02-09 DIAGNOSIS — D1801 Hemangioma of skin and subcutaneous tissue: Secondary | ICD-10-CM

## 2018-02-09 DIAGNOSIS — Z23 Encounter for immunization: Secondary | ICD-10-CM | POA: Diagnosis not present

## 2018-02-09 DIAGNOSIS — E785 Hyperlipidemia, unspecified: Secondary | ICD-10-CM

## 2018-02-09 DIAGNOSIS — Z789 Other specified health status: Secondary | ICD-10-CM

## 2018-02-09 LAB — POCT GLYCOSYLATED HEMOGLOBIN (HGB A1C): Hemoglobin A1C: 6.7 % — AB (ref 4.0–5.6)

## 2018-02-09 MED ORDER — SEMAGLUTIDE(0.25 OR 0.5MG/DOS) 2 MG/1.5ML ~~LOC~~ SOPN: 0.5000 mg | PEN_INJECTOR | SUBCUTANEOUS | 5 refills | Status: DC

## 2018-02-09 MED ORDER — SEMAGLUTIDE(0.25 OR 0.5MG/DOS) 2 MG/1.5ML ~~LOC~~ SOPN: 0.5000 mg | PEN_INJECTOR | SUBCUTANEOUS | 0 refills | Status: DC

## 2018-02-09 NOTE — Assessment & Plan Note (Addendum)
Dramatically improved now controlled DM with A1c from 10.1 down to 6.7 on GLP1 and lifestyle Complications - other including hyperlipidemia, depression, morbid obesity - increases risk of future cardiovascular complications and poor glucose control due to reduced lifestyle diet/exercise with low energy mood and fatigue  Plan:  1. CONTINUE Ozempic 0.5mg  daily inj - decision to hold on dose increase - Offered and given sample pen for Ozempic due to financial cost Continue current therapy - Metformin 1000mg  BID 2. Encourage improved lifestyle - may continue paleo diet but emphasis on low carb, low sugar diet, reduce portion size, continue improving regular exercise 3. Check CBG, bring log to next visit for review 4. Continue ARB Follow-up 3 months annual and DM f/u

## 2018-02-09 NOTE — Assessment & Plan Note (Signed)
Clinically with morbid obesity despite BMI >38 - with co morbid conditions Type 2 Diabetes, Hyperlipidemia, and Depression Weight stable to fluctuating Continue on GLP1 - Encourage lifestyle improvement - continue diet and exercise regimen already with some weight loss

## 2018-02-09 NOTE — Patient Instructions (Addendum)
Thank you for coming to the office today.  Improved A1c to 6.7 - great job! Continue Ozempic current dose 0.5, we can reconsider increasing in future to 1mg  and may stop metformin if needed.  Keep up great work!  Check insurance cost/coverage of early screening colonoscopy with family history of polyps and we can plan to order this referral in January at next physical  Referral for skin lesion behind R ear - I think it is benign likely hemangioma but given location and appearance, and fam history I would support excision  [facility]   [place], [place] [address] Hours: 8AM-5PM Phone: [phone]  Sarina Ser, MD Brendolyn Patty, MD  DUE for [place] (no food or drink after midnight before the lab appointment, only water or coffee without cream/sugar on the morning of)  SCHEDULE "Lab Only" visit in the morning at the clinic for lab draw in 3 MONTHS   - Make sure Lab Only appointment is at about 1 week before your next appointment, so that results will be available  For Lab Results, once available within 2-3 days of blood draw, you can can log in to MyChart online to view your results and a brief explanation. Also, we can discuss results at next follow-up visit.   Please schedule a Follow-up Appointment to: Return in about 3 months (around 05/12/2018) for Annual Physical + Pap Smear.  If you have any other questions or concerns, please feel free to call the office or send a message through [place]. You may also schedule an earlier appointment if necessary.  Additionally, you may be receiving a survey about your experience at our office within a few days to 1 week by e-mail or mail. We value your feedback.  Nobie Putnam, DO [place]

## 2018-02-09 NOTE — Progress Notes (Signed)
Subjective:    Patient ID: Diana Cunningham, female    DOB: 02-05-1981, 37 y.o.   MRN: [id number]  Diana Cunningham is a 37 y.o. female presenting on 02/09/2018 for Diabetes   HPI   CHRONIC DM, Type 2 / Morbid Obesity BMI >38 w/ comorbidities - Last visit with me9/2018, for same problem, and new start GLP1 see prior notes for background information. - Today feels better with lower sugars CBGs: Avg100-120, Low >100 (with occasional mild symptoms but no actual hypoglycemia), [id number]. Checks CBGs 1-2x every day Meds: Ozempic 0.5mg  [place] weekly inj, Metformin 1000mg BID Reports good compliance. Tolerating well but does have some mild nausea if not eating regularly on schedule. Currently on ARB (Losartan half tab dose 12.5mg ) Lifestyle: - Weight stable without significant gain or loss since last visit, fluctuating - Diet (No significant diet change, previously overhauled and doing well with changes, low carb, paleo) - Exercise (Improved yoga for exercise again, helps her mood as well) - Last ophthalmology exam, [facility] Dr Lanny Hurst 09/2017 Denies hypoglycemia, polyuria, visual changes, numbness or tingling.  Suspected Hemangioma / Skin Lesion Reports chronic history of raised red skin lesion behind R ear. She has fam hstory of [facility] requesting evaluation and 2nd opinion. Occasional swelling and irritation from scratching. May have changed size some.  Health Maintenance: Due for Flu Shot, will receive today   Family History  Problem Relation Age of Onset  . Hypertension Mother   . Depression Mother   . Basal cell carcinoma Mother     Sister 66, she had previous screening with colonoscopy in earlier age 84s. But strong family history of colon polyps in both mother and father.   Depression screen [facility] 2/9 02/09/2018 11/03/2017 01/27/2017  Decreased Interest 0 0 0  Down, Depressed, Hopeless 0 0 0  PHQ - 2 Score 0 0 0  Altered sleeping - 1 2  Tired, decreased energy - 0 1  Change  in appetite - 0 0  Feeling bad or failure about yourself  - 1 0  Trouble concentrating - 0 0  Moving slowly or fidgety/restless - 2 0  Suicidal thoughts - 0 0  PHQ-9 Score - 4 3  Difficult doing work/chores - Not difficult at all Somewhat difficult    Social History   Tobacco Use  . Smoking status: Former Smoker    Packs/day: 1.00    Years: 1.00    Pack years: 1.00    Types: Cigarettes    Last attempt to quit: 05/10/1999    Years since quitting: 18.7  . Smokeless tobacco: Former Network engineer Use Topics  . Alcohol use: Yes    Alcohol/week: 0.0 standard drinks    Comment: on occasion  . Drug use: No    Review of Systems Per HPI unless specifically indicated above     Objective:    BP 126/80   Pulse 91   Temp 98.4 F (36.9 C) (Oral)   Resp 16   Ht 5\' 6"  (1.676 m)   Wt 240 lb (108.9 kg)   BMI 38.74 kg/m   Wt Readings from Last 3 Encounters:  02/09/18 240 lb (108.9 kg)  11/03/17 237 lb (107.5 kg)  01/27/17 239 lb (108.4 kg)    Physical Exam  Constitutional: She is oriented to person, place, and time. She appears well-developed and well-nourished. No distress.  Well-appearing, comfortable, cooperative, obese  HENT:  Head: Normocephalic and atraumatic.  Mouth/Throat: Oropharynx is clear and moist.  Eyes: Conjunctivae are normal. Right  eye exhibits no discharge. Left eye exhibits no discharge.  Cardiovascular: Normal rate.  Pulmonary/Chest: Effort normal.  Musculoskeletal: She exhibits no edema.  Neurological: She is alert and oriented to person, place, and time.  Skin: Skin is warm and dry. No rash noted. She is not diaphoretic. No erythema.  1 cm raised red nodule, c/w hemangioma slight firmness, behind R helix on head. Non tender  Psychiatric: She has a normal mood and affect. Her behavior is normal.  Well groomed, good eye contact, normal speech and thoughts  Nursing note and vitals reviewed.    Recent Labs    05/19/17 0805 11/03/17 1436 02/09/18 1347   HGBA1C 7.5* 10.1* 6.7*    Results for orders placed or performed in visit on 02/09/18  POCT HgB A1C  Result Value Ref Range   Hemoglobin A1C 6.7 (A) 4.0 - 5.6 %      Assessment & Plan:   Problem List Items Addressed This Visit    Morbid obesity ([place])    Clinically with morbid obesity despite BMI >38 - with co morbid conditions Type 2 Diabetes, Hyperlipidemia, and Depression Weight stable to fluctuating Continue on GLP1 - Encourage lifestyle improvement - continue diet and exercise regimen already with some weight loss      Relevant Medications   Semaglutide,0.25 or 0.5MG /DOS, (OZEMPIC, 0.25 OR 0.5 MG/DOSE,) 2 MG/1.5ML SOPN   Type 2 diabetes mellitus with other specified complication ([place]) - Primary    Dramatically improved now controlled DM with A1c from 10.1 down to 6.7 on GLP1 and lifestyle Complications - other including hyperlipidemia, depression, morbid obesity - increases risk of future cardiovascular complications and poor glucose control due to reduced lifestyle diet/exercise with low energy mood and fatigue  Plan:  1. CONTINUE Ozempic 0.5mg  daily inj - decision to hold on dose increase - Offered and given sample pen for Ozempic due to financial cost Continue current therapy - Metformin 1000mg  BID 2. Encourage improved lifestyle - may continue paleo diet but emphasis on low carb, low sugar diet, reduce portion size, continue improving regular exercise 3. Check CBG, bring log to next visit for review 4. Continue ARB Follow-up 3 months annual and DM f/u      Relevant Medications   Semaglutide,0.25 or 0.5MG /DOS, (OZEMPIC, 0.25 OR 0.5 MG/DOSE,) 2 MG/1.5ML SOPN   Other Relevant Orders   POCT HgB A1C (Completed)    Other Visit Diagnoses    Needs flu shot       Relevant Orders   Flu Vaccine QUAD 6+ mos PF IM (Fluarix Quad PF) (Completed)   Hemangioma of skin     Likely benign Chronic problem, some episodic irritation Fam history BCC Request 2nd opinion Dermatology  likely excisional bx - follow-up    Relevant Orders   Ambulatory referral to Dermatology      Meds ordered this encounter  Medications  . Semaglutide,0.25 or 0.5MG /DOS, (OZEMPIC, 0.25 OR 0.5 MG/DOSE,) 2 MG/1.5ML SOPN    Sig: Inject 0.5 mg into the skin once a week.    Dispense:  1 pen    Refill:  5    Keep additional refills on file, dose adjust to continue 0.5 weekly  . DISCONTD: Semaglutide,0.25 or 0.5MG /DOS, (OZEMPIC, 0.25 OR 0.5 MG/DOSE,) 2 MG/1.5ML SOPN    Sig: Inject 0.5 mg into the skin once a week.    Dispense:  1 pen    Refill:  0   Orders Placed This Encounter  Procedures  . Flu Vaccine QUAD 6+ mos PF IM (  Fluarix Quad PF)  . Ambulatory referral to Dermatology    Referral Priority:   Routine    Referral Type:   Consultation    Referral Reason:   Specialty Services Required    Referred to Provider:   Ralene Bathe, MD    Requested Specialty:   Dermatology    Number of Visits Requested:   1  . POCT HgB A1C    Follow up plan: Return in about 3 months (around 05/12/2018) for Annual Physical + Pap Smear.  Future labs ordered for 05/11/18  Nobie Putnam, [facility] 02/09/2018, 11:21 PM

## 2018-02-23 ENCOUNTER — Ambulatory Visit
Admission: RE | Admit: 2018-02-23 | Discharge: 2018-02-23 | Disposition: A | Discharge status: Home / Self Care | Payer: BLUE CROSS/BLUE SHIELD | Source: Ambulatory Visit | Attending: Family Medicine | Admitting: Family Medicine

## 2018-02-23 ENCOUNTER — Encounter: Payer: Self-pay | Admitting: Family Medicine

## 2018-02-23 ENCOUNTER — Ambulatory Visit (INDEPENDENT_AMBULATORY_CARE_PROVIDER_SITE_OTHER): Payer: BLUE CROSS/BLUE SHIELD | Admitting: Family Medicine

## 2018-02-23 VITALS — BP 125/76 | HR 108 | Temp 98.5°F | Resp 16 | Ht 66.0 in | Wt 239.0 lb

## 2018-02-23 DIAGNOSIS — M79671 Pain in right foot: Secondary | ICD-10-CM | POA: Insufficient documentation

## 2018-02-23 NOTE — Progress Notes (Signed)
Subjective:    Patient ID: Diana Cunningham, female    DOB: [birth year], 37 y.o.   MRN: [id number]  Diana Cunningham is a 37 y.o. 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 She denied any significant swelling or redness or bruising at that time or after. She had some soreness that continued, overall slightly better but now primarily pain with weight bearing, also painful to touch or if wearing shoe as it puts pressure at this point. - Not tried any Tylenol, Ibuprofen or NSAID - Denies fevers, chills, redness, rash, other joint pain or swelling  UTD Flu vaccine  Depression screen [facility] 2/9 02/23/2018 02/09/2018 11/03/2017  Decreased Interest 0 0 0  Down, Depressed, Hopeless 0 0 0  PHQ - 2 Score 0 0 0  Altered sleeping - - 1  Tired, decreased energy - - 0  Change in appetite - - 0  Feeling bad or failure about yourself  - - 1  Trouble concentrating - - 0  Moving slowly or fidgety/restless - - 2  Suicidal thoughts - - 0  PHQ-9 Score - - 4  Difficult doing work/chores - - Not difficult at all    Social History   Tobacco Use  . Smoking status: Former Smoker    Packs/day: 1.00    Years: 1.00    Pack years: 1.00    Types: Cigarettes    Last attempt to quit: 05/10/1999    Years since quitting: 18.8  . Smokeless tobacco: Former Network engineer Use Topics  . Alcohol use: Yes    Alcohol/week: 0.0 standard drinks    Comment: on occasion  . Drug use: No    Review of Systems Per HPI unless specifically indicated above     Objective:    BP 125/76   Pulse (!) 108   Temp 98.5 F (36.9 C) (Oral)   Resp 16   Ht 5\' 6"  (1.676 m)    Wt 239 lb (108.4 kg)   BMI 38.58 kg/m   Wt Readings from Last 3 Encounters:  02/23/18 239 lb (108.4 kg)  02/09/18 240 lb (108.9 kg)  11/03/17 237 lb (107.5 kg)    Physical Exam  Constitutional: She is oriented to person, place, and time. She appears well-developed and well-nourished. No distress.  Well-appearing, comfortable, cooperative  HENT:  Head: Normocephalic and atraumatic.  Mouth/Throat: Oropharynx is clear and moist.  Eyes: Conjunctivae are normal. Right eye exhibits no discharge. Left eye exhibits no discharge.  Cardiovascular: Normal rate.  Pulmonary/Chest: Effort normal.  Musculoskeletal: She exhibits no edema.  Right Foot Inspection Palpation: mostly normal appearance, bilateral baseline pes cavus arches, without erythema, ecchymosis, or edema. R foot dorsal MTP area distinct point of slightly raised bony prominence, significantly tender to touch only over this point, otherwise MTP and rest of forefoot mid foot or hindfoot non tender. ROM: full active ROM dorsiflex/plantar flex Strength: distal intact Neurovascular: distal intact  Able to stand and ambulate weight bear, some discomfort   Neurological: She is alert and oriented to person, place, and time.  Skin: Skin is warm and dry. No  rash noted. She is not diaphoretic. No erythema.  Psychiatric: She has a normal mood and affect. Her behavior is normal.  Well groomed, good eye contact, normal speech and thoughts  Nursing note and vitals reviewed.    I have personally reviewed the radiology report from 02/23/18 on R Foot X-ray.  CLINICAL DATA: Right foot pain  EXAM: RIGHT FOOT COMPLETE - 3+ VIEW  COMPARISON: None.  FINDINGS: There is no evidence of fracture or dislocation. There is no evidence of arthropathy or other focal bone abnormality. Soft tissues are unremarkable.  IMPRESSION: Negative.   Electronically Signed By: Donavan Foil M.D. On: 02/23/2018 20:45    Assessment & Plan:    Problem List Items Addressed This Visit    None    Visit Diagnoses    Right foot pain    -  Primary   Relevant Orders   DG Foot Complete Right (Completed)      Clinically uncertain etiology of R foot dorsal great toe MTP pain, seems stable without worsening, limited improvement after a week. Without evidence of bruising or other deformity. Localized bony prominence with tenderness only, otherwise no evidence of ankle or other foot pain or problem - Given mechanism w/ injury - most likely sprain - History of gout - unlikely given presentation  Plan Check R foot X-ray today to rule out fracture - sent results once available, see above, negative result Reassurance, recommend relative rest, foot stabilization w/ firm base shoe / CAM walker if prefer, let pain be guide for resuming regular activity / ambulation Tylenol - NSAID PRN Ice / Elevation PRN Follow-up if not improve - consider future Podiatry if needed  No orders of the defined types were placed in this encounter.   Follow up plan: Return in about 4 weeks (around 03/23/2018), or if symptoms worsen or fail to improve, for foot pain.  Diana Putnam, DO [facility] 02/23/2018, 9:18 PM

## 2018-02-23 NOTE — Patient Instructions (Addendum)
Thank you for coming to the office today.  For Right foot pain - we will do X-ray today, stay tuned for results, on mychart or available on Monday  Start anti inflammatory - Ibuprofen or Aleve as needed, may take Tylenol  Try to avoid direct pressure on it  Rest / relative rest with activity modification avoid overuse of joint  Ice packs (make sure you use a towel or sock / something to protect skin)  Elevation - if significant swelling, lift leg above heart level (toes above your nose) to help reduce swelling, most helpful at night after day of being on your feet  Confused why there is no bruising, but we will rule out fracture on imaging.  Try to find a firm wide shoe so that you do not aggravate it more.  Unlikely to be gout.  May try a post-op shoe or walking boot at pharmacy[facility]  Please schedule a Follow-up Appointment to: Return in about 4 weeks (around 03/23/2018), or if symptoms worsen or fail to improve, for foot pain.  If you have any other questions or concerns, please feel free to call the office or send a message through [place]. You may also schedule an earlier appointment if necessary.  Additionally, you may be receiving a survey about your experience at our office within a few days to 1 week by e-mail or mail. We value your feedback.  Nobie Putnam, DO [employer]

## 2018-03-11 ENCOUNTER — Other Ambulatory Visit: Payer: Self-pay | Admitting: Family Medicine

## 2018-03-11 DIAGNOSIS — E119 Type 2 diabetes mellitus without complications: Secondary | ICD-10-CM

## 2018-05-11 ENCOUNTER — Other Ambulatory Visit: Payer: BLUE CROSS/BLUE SHIELD

## 2018-05-11 DIAGNOSIS — Z789 Other specified health status: Secondary | ICD-10-CM

## 2018-05-11 DIAGNOSIS — Z0000 Encounter for general adult medical examination without abnormal findings: Secondary | ICD-10-CM

## 2018-05-11 DIAGNOSIS — E1169 Type 2 diabetes mellitus with other specified complication: Secondary | ICD-10-CM | POA: Diagnosis not present

## 2018-05-11 DIAGNOSIS — F3341 Major depressive disorder, recurrent, in partial remission: Secondary | ICD-10-CM | POA: Diagnosis not present

## 2018-05-11 DIAGNOSIS — E785 Hyperlipidemia, unspecified: Secondary | ICD-10-CM

## 2018-05-14 LAB — HEMOGLOBIN A1C
HEMOGLOBIN A1C: 7 %{Hb} — AB (ref <5.7)
MEAN PLASMA GLUCOSE: 154 (calc)
eAG (mmol/L): 8.5 (calc)

## 2018-05-14 LAB — COMPLETE METABOLIC PANEL WITH GFR
AG Ratio: 1.5 (calc) (ref 1.0–2.5)
ALT: 21 U/L (ref 6–29)
AST: 16 U/L (ref 10–30)
Albumin: 4.6 g/dL (ref 3.6–5.1)
Alkaline phosphatase (APISO): 100 U/L (ref 33–115)
BUN: 12 mg/dL (ref 7–25)
CALCIUM: 9.8 mg/dL (ref 8.6–10.2)
CO2: 26 mmol/L (ref 20–32)
CREATININE: 0.64 mg/dL (ref 0.50–1.10)
Chloride: 99 mmol/L (ref 98–110)
GFR, EST NON AFRICAN AMERICAN: 114 mL/min/{1.73_m2} (ref >=60)
GFR, Est African American: 132 mL/min/{1.73_m2} (ref >=60)
GLUCOSE: 148 mg/dL — AB (ref 65–99)
Globulin: 3 g/dL (calc) (ref 1.9–3.7)
Potassium: 4.2 mmol/L (ref 3.5–5.3)
SODIUM: 138 mmol/L (ref 135–146)
Total Bilirubin: 0.3 mg/dL (ref 0.2–1.2)
Total Protein: 7.6 g/dL (ref 6.1–8.1)

## 2018-05-14 LAB — CBC WITH DIFFERENTIAL/PLATELET
ABSOLUTE MONOCYTES: 649 {cells}/uL (ref 200–950)
Basophils Absolute: 72 cells/uL (ref 0–200)
Basophils Relative: 0.7 %
Eosinophils Absolute: 752 cells/uL — ABNORMAL HIGH (ref 15–500)
Eosinophils Relative: 7.3 %
HEMATOCRIT: 40.8 % (ref 35.0–45.0)
HEMOGLOBIN: 14.1 g/dL (ref 11.7–15.5)
LYMPHS ABS: 2987 {cells}/uL (ref 850–3900)
MCH: 29.9 pg (ref 27.0–33.0)
MCHC: 34.6 g/dL (ref 32.0–36.0)
MCV: 86.4 fL (ref 80.0–100.0)
MPV: 11 fL (ref 7.5–12.5)
Monocytes Relative: 6.3 %
NEUTROS ABS: 5840 {cells}/uL (ref 1500–7800)
Neutrophils Relative %: 56.7 %
Platelets: 308 10*3/uL (ref 140–400)
RBC: 4.72 10*6/uL (ref 3.80–5.10)
RDW: 13.3 % (ref 11.0–15.0)
Total Lymphocyte: 29 %
WBC: 10.3 10*3/uL (ref 3.8–10.8)

## 2018-05-14 LAB — MEASLES/MUMPS/RUBELLA IMMUNITY
Mumps IgG: 43.6 AU/mL
Rubella: <0.9 index — ABNORMAL LOW
Rubeola IgG: >300 AU/mL

## 2018-05-14 LAB — LIPID PANEL
Cholesterol: 174 mg/dL (ref <200)
HDL: 41 mg/dL — ABNORMAL LOW (ref >50)
LDL Cholesterol (Calc): 94 mg/dL (calc)
Non-HDL Cholesterol (Calc): 133 mg/dL (calc) — ABNORMAL HIGH (ref <130)
Total CHOL/HDL Ratio: 4.2 (calc) (ref <5.0)
Triglycerides: 294 mg/dL — ABNORMAL HIGH (ref <150)

## 2018-05-18 ENCOUNTER — Ambulatory Visit (INDEPENDENT_AMBULATORY_CARE_PROVIDER_SITE_OTHER): Payer: BLUE CROSS/BLUE SHIELD | Admitting: Family Medicine

## 2018-05-18 ENCOUNTER — Encounter: Payer: Self-pay | Admitting: Family Medicine

## 2018-05-18 ENCOUNTER — Other Ambulatory Visit (HOSPITAL_COMMUNITY)
Admission: RE | Admit: 2018-05-18 | Discharge: 2018-05-18 | Disposition: A | Discharge status: Home / Self Care | Payer: BLUE CROSS/BLUE SHIELD | Source: Ambulatory Visit | Attending: Family Medicine | Admitting: Family Medicine

## 2018-05-18 VITALS — BP 135/76 | HR 115 | Resp 16 | Ht 66.0 in | Wt 241.0 lb

## 2018-05-18 DIAGNOSIS — F3341 Major depressive disorder, recurrent, in partial remission: Secondary | ICD-10-CM

## 2018-05-18 DIAGNOSIS — Z0000 Encounter for general adult medical examination without abnormal findings: Secondary | ICD-10-CM

## 2018-05-18 DIAGNOSIS — Z124 Encounter for screening for malignant neoplasm of cervix: Secondary | ICD-10-CM

## 2018-05-18 DIAGNOSIS — E785 Hyperlipidemia, unspecified: Secondary | ICD-10-CM

## 2018-05-18 DIAGNOSIS — E1169 Type 2 diabetes mellitus with other specified complication: Secondary | ICD-10-CM | POA: Diagnosis not present

## 2018-05-18 NOTE — Progress Notes (Signed)
Subjective:    Patient ID: Diana Cunningham, female    DOB: Sep 20, 1980, 38 y.o.   MRN: [id number]  Diana Cunningham is a 38 y.o. female presenting on 05/18/2018 for Annual Exam (with Pap )   HPI   Here for Annual Physical and Lab Review.  CHRONIC DM, Type 2/ Morbid Obesity BMI >38 w/ comorbidities Doing well overall. A1c 7 from prior 6.7 CBGs: Avg100-120, Low >100 (with occasional mild symptoms but no actual hypoglycemia), High<200. Checks CBGs 1-2x every day Meds: Ozempic 0.11m  weekly inj, Metformin 10074mID - Recently some worse nausea few days later - Recently met deductible, cost of ozempic down to $25 Currently on ARB (Losartan half tab dose 12.11m39mLifestyle: - Weightstable - Diet (No significant diet change, still on low carb)) - Exercise (Improved yoga for exercise again, helps her mood as well) - Last ophthalmology exam, [id number] Denies hypoglycemia, polyuria, visual changes, numbness or tingling.  HYPERLIPIDEMIA: - Reports no concerns. Last lipid panel 05/2018, controlled LDL < 100, notable elevated TG Not on statin  DEPRESSION, Major - In Remission: Today reports doing very well and much improved on Escitalopram 31m10mily. Tolerating well without side effects Never on other medications. Never established with Psychiatry or Psychology therapy. Previously in 05/2016 she was increased from Escitalopram 10 to 31mg67mly - Admits still insomnia occasionally   Additionally - she admits history of some vulvodynia. Not interested to see GYN at this time.   Health Maintenance:  Cervical Cancer Screening: Prior history with normal pap results and negative HPV, last done 06/23/14 - double negative, results scanned. She is now due for repeat Pap Smear today. No complaints. No fam history of cervical or GYN cancer.  No fam history of colon cancer.  UTD Pneumonia vaccine - in setting of diabetes, before age 65.  11D Flu vaccine  02/2018  UTD MMR vaccine, did show Rubella non immune titer, will recheck in 1 year.  [facility] 02/23/2018 02/09/2018  Decreased Interest 0 0 0  Down, Depressed, Hopeless 0 0 0  PHQ - 2 Score 0 0 0  Altered sleeping 0 - -  Tired, decreased energy 0 - -  Change in appetite 0 - -  Feeling bad or failure about yourself  0 - -  Trouble concentrating 0 - -  Moving slowly or fidgety/restless 0 - -  Suicidal thoughts 0 - -  PHQ-9 Score 0 - -  Difficult doing work/chores - - -   No flowsheet data found.   Past Medical History:  Diagnosis Date  . Pilonidal cyst    Past Surgical History:  Procedure Laterality Date  . PILONIDAL CYST EXCISION  2005   Social History   Socioeconomic History  . Marital status: Single    Spouse name: Not on file  . Number of children: Not on file  . Years of education: Not on file  . Highest education level: Not on file  Occupational History  . Not on file  Social Needs  . Financial resource strain: Not on file  . Food insecurity:    Worry: Not on file    Inability: Not on file  . Transportation needs:    Medical: Not on file    Non-medical: Not on file  Tobacco Use  . Smoking status: Former Smoker    Packs/day: 1.00    Years: 1.00    Pack years: 1.00    Types: Cigarettes    Last attempt to quit:  05/10/1999    Years since quitting: 19.0  . Smokeless tobacco: Former Network engineer and Sexual Activity  . Alcohol use: Yes    Alcohol/week: 0.0 standard drinks    Comment: on occasion  . Drug use: No  . Sexual activity: Yes  Lifestyle  . Physical activity:    Days per week: Not on file    Minutes per session: Not on file  . Stress: Not on file  Relationships  . Social connections:    Talks on phone: Not on file    Gets together: Not on file    Attends religious service: Not on file    Active member of club or organization: Not on file    Attends meetings of clubs or organizations: Not on file    Relationship  status: Not on file  . Intimate partner violence:    Fear of current or ex partner: Not on file    Emotionally abused: Not on file    Physically abused: Not on file    Forced sexual activity: Not on file  Other Topics Concern  . Not on file  Social History Narrative  . Not on file   Family History  Problem Relation Age of Onset  . Hypertension Mother   . Depression Mother   . Basal cell carcinoma Mother   . Breast cancer Neg Hx   . Colon cancer Neg Hx    Current Outpatient Medications on File Prior to Visit  Medication Sig  . escitalopram (LEXAPRO) 20 MG tablet Take 1 tablet (20 mg total) by mouth daily.  . Insulin Pen Needle (NOVOFINE PLUS) 32G X 4 MM MISC Use with Ozempic weekly injection as instructed  . Loratadine (CLARITIN PO) Claritin  . losartan (COZAAR) 25 MG tablet TAKE 1/2 TABLET(12.5 MG) BY MOUTH DAILY  . metFORMIN (GLUCOPHAGE) 500 MG tablet TAKE 2 TABLETS(1000 MG) BY MOUTH TWICE DAILY WITH A MEAL  . ONETOUCH DELICA LANCETS 24O MISC U UTD BID  . Semaglutide,0.25 or 0.5MG/DOS, (OZEMPIC, 0.25 OR 0.5 MG/DOSE,) 2 MG/1.5ML SOPN Inject 0.5 mg into the skin once a week.  . Blood Glucose Monitoring Suppl (ONE TOUCH ULTRA 2) w/Device KIT OneTouch Ultra2 Meter kit  TK UTD BID   No current facility-administered medications on file prior to visit.     Review of Systems Per HPI unless specifically indicated above      Objective:    BP 135/76   Pulse (!) 115   Resp 16   Ht '5\' 6"'  (1.676 m)   Wt 241 lb (109.3 kg)   LMP 04/19/2018 (Approximate)   SpO2 100%   BMI 38.90 kg/m   Wt Readings from Last 3 Encounters:  05/18/18 241 lb (109.3 kg)  02/23/18 239 lb (108.4 kg)  02/09/18 240 lb (108.9 kg)    Physical Exam Vitals signs and nursing note reviewed.  Constitutional:      General: She is not in acute distress.    Appearance: She is well-developed. She is not diaphoretic.     Comments: Well-appearing, comfortable, cooperative, obese  HENT:     Head: Normocephalic  and atraumatic.     Comments: Frontal / maxillary sinuses non-tender. Nares patent without purulence or edema. Bilateral TMs clear without erythema, effusion or bulging. Oropharynx clear without erythema, exudates, edema or asymmetry. Eyes:     General:        Right eye: No discharge.        Left eye: No discharge.     Conjunctiva/sclera:  Conjunctivae normal.     Pupils: Pupils are equal, round, and reactive to light.  Neck:     Musculoskeletal: Normal range of motion and neck supple.     Thyroid: No thyromegaly.  Cardiovascular:     Rate and Rhythm: Normal rate and regular rhythm.     Heart sounds: Normal heart sounds. No murmur.  Pulmonary:     Effort: Pulmonary effort is normal. No respiratory distress.     Breath sounds: Normal breath sounds. No wheezing or rales.  Abdominal:     General: Bowel sounds are normal. There is no distension.     Palpations: Abdomen is soft. There is no mass.     Tenderness: There is no abdominal tenderness.  Genitourinary:    Comments: Normal external female genitalia. Vaginal canal without lesions. Normal appearing cervix, without lesions or bleeding. Physiologic discharge on exam. Bimanual exam without masses or cervical motion tenderness.  Pap smear specimen collected.  Pelvic Exam chaperoned by Theresia Majors, CMA  Musculoskeletal: Normal range of motion.        General: No tenderness.     Comments: Upper / Lower Extremities: - Normal muscle tone, strength bilateral upper extremities 5/5, lower extremities 5/5  Lymphadenopathy:     Cervical: No cervical adenopathy.  Skin:    General: Skin is warm and dry.     Findings: No erythema or rash.  Neurological:     Mental Status: She is alert and oriented to person, place, and time.     Comments: Distal sensation intact to light touch all extremities  Psychiatric:        Behavior: Behavior normal.     Comments: Well groomed, good eye contact, normal speech and thoughts        Results for  orders placed or performed in visit on 05/11/18  Measles/Mumps/Rubella Immunity  Result Value Ref Range   Rubeola IgG >300.00 AU/mL   Mumps IgG 43.60 AU/mL   Rubella <0.90 (L) index  Lipid panel  Result Value Ref Range   Cholesterol 174 <200 mg/dL   HDL 41 (L) >50 mg/dL   Triglycerides 294 (H) <150 mg/dL   LDL Cholesterol (Calc) 94 mg/dL (calc)   Total CHOL/HDL Ratio 4.2 <5.0 (calc)   Non-HDL Cholesterol (Calc) 133 (H) <130 mg/dL (calc)  COMPLETE METABOLIC PANEL WITH GFR  Result Value Ref Range   Glucose, Bld 148 (H) 65 - 99 mg/dL   BUN 12 7 - 25 mg/dL   Creat 0.64 0.50 - 1.10 mg/dL   GFR, Est Non African American 114 > OR = 60 mL/min/1.57m   GFR, Est African American 132 > OR = 60 mL/min/1.769m  BUN/Creatinine Ratio NOT APPLICABLE 6 - 22 (calc)   Sodium 138 135 - 146 mmol/L   Potassium 4.2 3.5 - 5.3 mmol/L   Chloride 99 98 - 110 mmol/L   CO2 26 20 - 32 mmol/L   Calcium 9.8 8.6 - 10.2 mg/dL   Total Protein 7.6 6.1 - 8.1 g/dL   Albumin 4.6 3.6 - 5.1 g/dL   Globulin 3.0 1.9 - 3.7 g/dL (calc)   AG Ratio 1.5 1.0 - 2.5 (calc)   Total Bilirubin 0.3 0.2 - 1.2 mg/dL   Alkaline phosphatase (APISO) 100 33 - 115 U/L   AST 16 10 - 30 U/L   ALT 21 6 - 29 U/L  CBC with Differential/Platelet  Result Value Ref Range   WBC 10.3 3.8 - 10.8 Thousand/uL   RBC 4.72 3.80 - 5.10 Million/uL  Hemoglobin 14.1 11.7 - 15.5 g/dL   HCT 40.8 35.0 - 45.0 %   MCV 86.4 80.0 - 100.0 fL   MCH 29.9 27.0 - 33.0 pg   MCHC 34.6 32.0 - 36.0 g/dL   RDW 13.3 11.0 - 15.0 %   Platelets 308 140 - 400 Thousand/uL   MPV 11.0 7.5 - 12.5 fL   Neutro Abs 5,840 1,500 - 7,800 cells/uL   Lymphs Abs 2,987 850 - 3,900 cells/uL   Absolute Monocytes 649 200 - 950 cells/uL   Eosinophils Absolute 752 (H) 15 - 500 cells/uL   Basophils Absolute 72 0 - 200 cells/uL   Neutrophils Relative % 56.7 %   Total Lymphocyte 29.0 %   Monocytes Relative 6.3 %   Eosinophils Relative 7.3 %   Basophils Relative 0.7 %  Hemoglobin A1c   Result Value Ref Range   Hgb A1c MFr Bld 7.0 (H) <5.7 % of total Hgb   Mean Plasma Glucose 154 (calc)   eAG (mmol/L) 8.5 (calc)      Assessment & Plan:   Problem List Items Addressed This Visit    Hyperlipidemia associated with type 2 diabetes mellitus ([place])    Controlled LDL. Elevated TG Last lipid panel 05/2018 Calculated ASCVD 10 yr risk score low risk  Plan: 1. Discussed ASCVD risk - Recommend Fish Oil omega 3 OTC 1g BID wc for now - adjust 2. Encourage improved lifestyle - low carb/cholesterol, reduce portion size, continue improving regular exercise      Morbid obesity (HCC)    Clinically with morbid obesity despite BMI >38 - with co morbid conditions Type 2 Diabetes, Hyperlipidemia, and Depression Weight stable now without loss Continue on GLP1 = future consider dose inc to 19m - Encourage lifestyle improvement - continue diet and exercise      Recurrent major depression in partial remission ([place]    Stable, controlled mood on SSRI Some underlying insomnia and tired, remains stable PHQ 0 No current psychiatry  Plan Continue current Escitalopram 214mdaily - continue current therapy may adjust in future if need Follow-up      Type 2 diabetes mellitus with other specified complication [place]   Dramatically improved now controlled DM with A1c from 10.1 down to 6.7 on GLP1 and lifestyle Complications - other including hyperlipidemia, depression, morbid obesity - increases risk of future cardiovascular complications and poor glucose control due to reduced lifestyle diet/exercise with low energy mood and fatigue  Plan:  1. CONTINUE Ozempic 0.14m24maily inj - decision to hold on dose increase again due to some nausea, - ultimately may benefit from wt reduction and continued A1c improve in future - Continue current therapy - Metformin 1000m74mD 2. Encourage improved lifestyle - may continue low carb, low sugar diet, reduce portion size, continue improving regular  exercise 3. Check CBG, bring log to next visit for review 4. Continue ARB Follow-up 6 months annual and DM f/u       Other Visit Diagnoses    Annual physical exam    -  Primary   Screening for cervical cancer       Relevant Orders   Cytology - PAP      Updated Health Maintenance information - Pap smear collected today Reviewed recent lab results with patient Encouraged improvement to lifestyle with diet and exercise - Goal of weight loss   No orders of the defined types were placed in this encounter.   Follow up plan: Return in about 6 months (around 11/16/2018)  for DM A1c.  Nobie Putnam, [facility] 05/18/2018, 9:10 AM

## 2018-05-18 NOTE — Assessment & Plan Note (Signed)
Dramatically improved now controlled DM with A1c from 10.1 down to 6.7 on GLP1 and lifestyle Complications - other including hyperlipidemia, depression, morbid obesity - increases risk of future cardiovascular complications and poor glucose control due to reduced lifestyle diet/exercise with low energy mood and fatigue  Plan:  1. CONTINUE Ozempic 0.5mg  daily inj - decision to hold on dose increase again due to some nausea, - ultimately may benefit from wt reduction and continued A1c improve in future - Continue current therapy - Metformin 1000mg  BID 2. Encourage improved lifestyle - may continue low carb, low sugar diet, reduce portion size, continue improving regular exercise 3. Check CBG, bring log to next visit for review 4. Continue ARB Follow-up 6 months annual and DM f/u

## 2018-05-18 NOTE — Assessment & Plan Note (Addendum)
Clinically with morbid obesity despite BMI >38 - with co morbid conditions Type 2 Diabetes, Hyperlipidemia, and Depression Weight stable now without loss Continue on GLP1 = future consider dose inc to 1mg  - Encourage lifestyle improvement - continue diet and exercise

## 2018-05-18 NOTE — Assessment & Plan Note (Signed)
Controlled LDL. Elevated TG Last lipid panel 05/2018 Calculated ASCVD 10 yr risk score low risk  Plan: 1. Discussed ASCVD risk - Recommend Fish Oil omega 3 OTC 1g BID wc for now - adjust 2. Encourage improved lifestyle - low carb/cholesterol, reduce portion size, continue improving regular exercise

## 2018-05-18 NOTE — Assessment & Plan Note (Signed)
Stable, controlled mood on SSRI Some underlying insomnia and tired, remains stable PHQ 0 No current psychiatry  Plan Continue current Escitalopram 20mg daily - continue current therapy may adjust in future if need Follow-up 

## 2018-05-18 NOTE — Patient Instructions (Addendum)
Thank you for coming to the office today.  Elevated triglycerides, consider starting Fish Oil Omega 3  - 1,072m capsule twice daily with food.  A1c up to 7.0, - keep improving lifestyle / diet exercise  Future consider repeat MMR titer with labs - if still Rubella non immune, can get 2nd dose MMR vaccine.  For nausea, try Vitamin B6, Ginger supplement, or in evening can try Doxylamine. - check out other home remedy if interested - notify me if you want a rx Zofran as needed  Stay tuned for pap smear results next week - if interested in GYN let me know we can refer. [facility] or [facility]   Please schedule a Follow-up Appointment to: Return in about 6 months (around 11/16/2018) for DM A1c.  If you have any other questions or concerns, please feel free to call the office or send a message through [facility] You may also schedule an earlier appointment if necessary.  Additionally, you may be receiving a survey about your experience at our office within a few days to 1 week by e-mail or mail. We value your feedback.  ANobie Putnam DO [facility]

## 2018-05-22 LAB — CYTOLOGY - PAP
Diagnosis: NEGATIVE
HPV: NOT DETECTED

## 2018-06-15 DIAGNOSIS — D1800 Hemangioma unspecified site: Secondary | ICD-10-CM | POA: Diagnosis not present

## 2018-06-15 DIAGNOSIS — L700 Acne vulgaris: Secondary | ICD-10-CM | POA: Diagnosis not present

## 2018-06-15 DIAGNOSIS — Z1283 Encounter for screening for malignant neoplasm of skin: Secondary | ICD-10-CM | POA: Diagnosis not present

## 2018-06-15 DIAGNOSIS — L821 Other seborrheic keratosis: Secondary | ICD-10-CM | POA: Diagnosis not present

## 2018-06-22 ENCOUNTER — Other Ambulatory Visit: Payer: Self-pay

## 2018-06-22 ENCOUNTER — Ambulatory Visit (INDEPENDENT_AMBULATORY_CARE_PROVIDER_SITE_OTHER): Payer: BLUE CROSS/BLUE SHIELD | Admitting: Family Medicine

## 2018-06-22 ENCOUNTER — Encounter: Payer: Self-pay | Admitting: Family Medicine

## 2018-06-22 VITALS — BP 133/88 | HR 89 | Temp 98.2°F | Resp 16 | Ht 66.0 in | Wt 241.6 lb

## 2018-06-22 DIAGNOSIS — L680 Hirsutism: Secondary | ICD-10-CM

## 2018-06-22 DIAGNOSIS — N926 Irregular menstruation, unspecified: Secondary | ICD-10-CM

## 2018-06-22 DIAGNOSIS — Z842 Family history of other diseases of the genitourinary system: Secondary | ICD-10-CM

## 2018-06-22 NOTE — Progress Notes (Signed)
Subjective:    Patient ID: Diana Cunningham, female    DOB: Dec 07, 1980, 38 y.o.   MRN: [id number]  Diana Cunningham is a 38 y.o. female presenting on 06/22/2018 for PCOS and Menstrual Problem (irregular)   HPI   Possible PCOS / Hyperandrogenism / Menstrual Irregularity Today here to discuss testing for possible PCOS. She recently contacted me via mychart after her Dermatologist recommended further work-up. Menstrual cycle irregularity for past 10 years has had menstrual irregularity, cycle several months apart, sometimes more irregular. She used to take OCP in past age early 101s. Sister diagnosed with PCOS in age 61s. - She has history of type 2 diabetes, and is treated on Metformin - Admits facial hair and acne   Depression screen [facility] 2/9 06/22/2018 05/18/2018 02/23/2018  Decreased Interest 0 0 0  Down, Depressed, Hopeless 0 0 0  PHQ - 2 Score 0 0 0  Altered sleeping 1 0 -  Tired, decreased energy 1 0 -  Change in appetite 0 0 -  Feeling bad or failure about yourself  0 0 -  Trouble concentrating 0 0 -  Moving slowly or fidgety/restless 0 0 -  Suicidal thoughts 0 0 -  PHQ-9 Score 2 0 -  Difficult doing work/chores - - -    Social History   Tobacco Use  . Smoking status: Former Smoker    Packs/day: 1.00    Years: 1.00    Pack years: 1.00    Types: Cigarettes    Last attempt to quit: 05/10/1999    Years since quitting: 19.1  . Smokeless tobacco: Former Network engineer Use Topics  . Alcohol use: Yes    Alcohol/week: 0.0 standard drinks    Comment: on occasion  . Drug use: No    Review of Systems  Constitutional: Negative for activity change, appetite change, chills, diaphoresis, fatigue and fever.  HENT: Negative for congestion.   Eyes: Negative for visual disturbance.  Respiratory: Negative for apnea, cough, chest tightness, shortness of breath and wheezing.   Cardiovascular: Negative for chest pain, palpitations and leg swelling.  Gastrointestinal: Negative for abdominal  pain, constipation, diarrhea, nausea and vomiting.  Endocrine: Negative for cold intolerance, heat intolerance and polyuria.  Genitourinary: Negative for difficulty urinating, dysuria, frequency and hematuria.  Musculoskeletal: Negative for arthralgias and neck pain.  Skin: Negative for rash.       Acne  Allergic/Immunologic: Negative for environmental allergies.  Neurological: Negative for dizziness, weakness, light-headedness, numbness and headaches.  Hematological: Negative for adenopathy.  Psychiatric/Behavioral: Negative for behavioral problems, dysphoric mood and sleep disturbance.   Per HPI unless specifically indicated above     Objective:    BP 133/88 (BP Location: Left Arm, Cuff Size: Normal)   Pulse 89   Temp 98.2 F (36.8 C) (Oral)   Resp 16   Ht 5\' 6"  (1.676 m)   Wt 241 lb 9.6 oz (109.6 kg)   BMI 39.00 kg/m   Wt Readings from Last 3 Encounters:  06/22/18 241 lb 9.6 oz (109.6 kg)  05/18/18 241 lb (109.3 kg)  02/23/18 239 lb (108.4 kg)    Physical Exam Vitals signs and nursing note reviewed.  Constitutional:      General: She is not in acute distress.    Appearance: She is well-developed. She is not diaphoretic.     Comments: Well-appearing, comfortable, cooperative, obese  HENT:     Head: Normocephalic and atraumatic.  Eyes:     General:  Right eye: No discharge.        Left eye: No discharge.     Conjunctiva/sclera: Conjunctivae normal.  Cardiovascular:     Rate and Rhythm: Normal rate.  Pulmonary:     Effort: Pulmonary effort is normal.  Skin:    General: Skin is warm and dry.     Findings: No erythema or rash.     Comments: Mild facial acne present. Slight increased facial hair.  Neurological:     Mental Status: She is alert and oriented to person, place, and time.  Psychiatric:        Behavior: Behavior normal.     Comments: Well groomed, good eye contact, normal speech and thoughts      Recent Labs    11/03/17 1436 02/09/18 1347  05/11/18 0749  HGBA1C 10.1* 6.7* 7.0*    Results for orders placed or performed in visit on 05/18/18  Cytology - PAP  Result Value Ref Range   Adequacy      Satisfactory for evaluation  endocervical/transformation zone component PRESENT.   Diagnosis      NEGATIVE FOR INTRAEPITHELIAL LESIONS OR MALIGNANCY.   HPV NOT DETECTED    Material Submitted CervicoVaginal Pap [ThinPrep Imaged]    CYTOLOGY - PAP PAP RESULT       Assessment & Plan:   Problem List Items Addressed This Visit    None    Visit Diagnoses    Irregular menstrual cycle    -  Primary   Relevant Orders   TSH   Testosterone   Follicle stimulating hormone   Prolactin   US Transvaginal Non-OB   Family history of PCOS       Relevant Orders   TSH   Testosterone   Follicle stimulating hormone   Prolactin   US Transvaginal Non-OB   Female hirsutism       Relevant Orders   TSH   Testosterone   Follicle stimulating hormone   Prolactin      Clinically suspect PCOS in patient with two out of three [place] criteria with reduced frequency oligomenorrhea and evidence of hyperandrogenism (facial acne and some hair growth). - Pending ultrasound evaluation of ovaries/uterus - Additionally patient has insulin resistance with Type 2 Diabetes, and obesity  Plan Proceed with work-up - Testosterone total, FSH, TSH, Prolactin, already had recent labs with chemistry and CBC. - Order transvaginal non OB ultrasound for [facility] - pending schedule - Discussed general treatment including metformin and also OCP combined progesterone/estrogen, she is pursing Spironolactone through Dermatology - [place] as needed after results - future may refer to GYN if interested  No orders of the defined types were placed in this encounter.  Orders Placed This Encounter  Procedures  . US Transvaginal Non-OB    Standing Status:   Future    Standing Expiration Date:   08/21/2019    Order Specific Question:    Reason for Exam (SYMPTOM  OR DIAGNOSIS REQUIRED)    Answer:   evaluation for possible PCOS, menstrual irregularity    Order Specific Question:   Preferred imaging location?    Answer:   [facility]  . TSH  . Testosterone  . Follicle stimulating hormone  . Prolactin    Follow up plan: Return for keep apt July.   Nobie Putnam, DO [facility] 06/22/2018, 8:15 AM

## 2018-06-22 NOTE — Patient Instructions (Addendum)
Thank you for coming to the office today.  Testing today for PCOS - as discussed, stay tuned for results on mychart  Someone will call to schedule a Transvaginal Ultrasound at [facility]. Stay tuned for result.  Agree with proceeding with Spironolactone from Dermatology. Your Potassium is normal.   Please schedule a Follow-up Appointment to: Return for keep apt July.  If you have any other questions or concerns, please feel free to call the office or send a message through [facility]. You may also schedule an earlier appointment if necessary.  Additionally, you may be receiving a survey about your experience at our office within a few days to 1 week by e-mail or mail. We value your feedback.  Nobie Putnam, DO [place]

## 2018-06-25 LAB — TESTOSTERONE, TOTAL, LC/MS/MS: Testosterone, Total, LC-MS-MS: 10 ng/dL (ref 2–45)

## 2018-06-25 LAB — PROLACTIN: Prolactin: 8.6 ng/mL

## 2018-06-25 LAB — TSH: TSH: 1.35 mIU/L

## 2018-06-25 LAB — FOLLICLE STIMULATING HORMONE: FSH: 3.2 m[IU]/mL

## 2018-06-27 ENCOUNTER — Ambulatory Visit: Payer: BLUE CROSS/BLUE SHIELD

## 2018-06-29 ENCOUNTER — Ambulatory Visit
Admission: RE | Admit: 2018-06-29 | Discharge: 2018-06-29 | Disposition: A | Discharge status: Home / Self Care | Payer: BLUE CROSS/BLUE SHIELD | Source: Ambulatory Visit | Attending: Family Medicine | Admitting: Family Medicine

## 2018-06-29 DIAGNOSIS — N926 Irregular menstruation, unspecified: Secondary | ICD-10-CM | POA: Diagnosis not present

## 2018-06-29 DIAGNOSIS — Z842 Family history of other diseases of the genitourinary system: Secondary | ICD-10-CM

## 2018-07-13 DIAGNOSIS — L700 Acne vulgaris: Secondary | ICD-10-CM | POA: Diagnosis not present

## 2018-07-20 DIAGNOSIS — L700 Acne vulgaris: Secondary | ICD-10-CM | POA: Diagnosis not present

## 2018-07-20 DIAGNOSIS — L905 Scar conditions and fibrosis of skin: Secondary | ICD-10-CM | POA: Diagnosis not present

## 2018-09-04 ENCOUNTER — Other Ambulatory Visit: Payer: Self-pay | Admitting: Family Medicine

## 2018-09-04 DIAGNOSIS — E119 Type 2 diabetes mellitus without complications: Secondary | ICD-10-CM

## 2018-09-19 DIAGNOSIS — H8111 Benign paroxysmal vertigo, right ear: Secondary | ICD-10-CM

## 2018-09-19 MED ORDER — MECLIZINE HCL 25 MG PO TABS: 25.0000 mg | ORAL_TABLET | Freq: Three times a day (TID) | ORAL | 0 refills | Status: DC | PRN

## 2018-09-27 ENCOUNTER — Other Ambulatory Visit: Payer: Self-pay | Admitting: Family Medicine

## 2018-09-27 DIAGNOSIS — E1169 Type 2 diabetes mellitus with other specified complication: Secondary | ICD-10-CM

## 2018-10-03 DIAGNOSIS — L905 Scar conditions and fibrosis of skin: Secondary | ICD-10-CM | POA: Diagnosis not present

## 2018-10-03 DIAGNOSIS — L700 Acne vulgaris: Secondary | ICD-10-CM | POA: Diagnosis not present

## 2018-11-13 ENCOUNTER — Other Ambulatory Visit: Payer: Self-pay

## 2018-11-16 ENCOUNTER — Ambulatory Visit: Payer: BLUE CROSS/BLUE SHIELD | Admitting: Family Medicine

## 2018-11-24 ENCOUNTER — Other Ambulatory Visit: Payer: Self-pay | Admitting: Family Medicine

## 2018-11-24 DIAGNOSIS — F3341 Major depressive disorder, recurrent, in partial remission: Secondary | ICD-10-CM

## 2019-01-22 ENCOUNTER — Other Ambulatory Visit: Payer: Self-pay | Admitting: Family Medicine

## 2019-01-22 DIAGNOSIS — E1169 Type 2 diabetes mellitus with other specified complication: Secondary | ICD-10-CM

## 2019-03-28 ENCOUNTER — Ambulatory Visit (INDEPENDENT_AMBULATORY_CARE_PROVIDER_SITE_OTHER): Payer: BC Managed Care – PPO | Admitting: Family Medicine

## 2019-03-28 ENCOUNTER — Encounter: Payer: Self-pay | Admitting: Family Medicine

## 2019-03-28 ENCOUNTER — Other Ambulatory Visit: Payer: Self-pay

## 2019-03-28 DIAGNOSIS — E1169 Type 2 diabetes mellitus with other specified complication: Secondary | ICD-10-CM | POA: Diagnosis not present

## 2019-03-28 DIAGNOSIS — R110 Nausea: Secondary | ICD-10-CM | POA: Diagnosis not present

## 2019-03-28 DIAGNOSIS — Z20822 Contact with and (suspected) exposure to covid-19: Secondary | ICD-10-CM

## 2019-03-28 DIAGNOSIS — Z20828 Contact with and (suspected) exposure to other viral communicable diseases: Secondary | ICD-10-CM | POA: Diagnosis not present

## 2019-03-28 MED ORDER — OZEMPIC (1 MG/DOSE) 2 MG/1.5ML ~~LOC~~ SOPN: 1.0000 mg | PEN_INJECTOR | SUBCUTANEOUS | 1 refills | Status: DC

## 2019-03-28 MED ORDER — ONDANSETRON 4 MG PO TBDP: 4.0000 mg | ORAL_TABLET | Freq: Three times a day (TID) | ORAL | 0 refills | Status: DC | PRN

## 2019-03-28 NOTE — Patient Instructions (Addendum)
Increase ozempic from 0.5mg  up to 1mg  - once weekly injection, TWO pens per month now. Sent 90 day or 6 pen After start new med, reduce Metformin from x 2 pills 500 twice a day down to 500mg  ONE pill twice a day  Return in 2 weeks after COVID testing completed if negative to do rest of diabetic exam and sugar check A1c  Take Zofran nausea medicine as needed  --------------------------------------- You may have coronavirus / [place] Testing Information  All you need to do is arrive at a testing site. No appointment needed.  Hours (Open 8 a.m. - 3:45 p.m.) LAST TEST completed at 3:30pm  [facility]: [facility] Entrance - [place], [place]: [place], [place], [place], [place] (entrance off [address])  Test result may take 2-7 days to result. You will be notified by MyChart or by Phone.  Phone: [phone] [facility] contact, can inquire about status of test result)  If negative test - they will call you with result. If abnormal or positive test you will be notified as well and our office will contact you to help further with treatment plan.  May take Tylenol as needed for aches pains and fever. Prefer to avoid Ibuprofen if can help it, to avoid complication from virus.  REQUIRED self quarantine to [place] - advised to avoid all exposure with others while during treatment. Should continue to quarantine for up to 7-14 days, pending resolution of symptoms, if symptoms resolve by 7 days and is afebrile >3 days - may STOP self quarantine at that time.  If symptoms do not resolve or significantly improve OR if WORSENING - fever / cough - or worsening shortness of breath - then should contact us and seek advice on next steps in treatment at home vs where/when to seek care at Urgent Care or Hospital ED for further intervention    Please schedule a Follow-up Appointment to: Return  in about 2 weeks (around 04/11/2019), or if symptoms worsen or fail to improve, for 2 weeks Diabetes A1c, Foot, med adjust.  If you have any other questions or concerns, please feel free to call the office or send a message through [place]. You may also schedule an earlier appointment if necessary.  Additionally, you may be receiving a survey about your experience at our office within a few days to 1 week by e-mail or mail. We value your feedback.  Nobie Putnam, DO [place]

## 2019-03-28 NOTE — Progress Notes (Addendum)
Virtual Visit via Telephone The purpose of this virtual visit is to provide medical care while limiting exposure to the novel coronavirus (COVID19) for both patient and office staff.  Consent was obtained for phone visit:  Yes.   Answered questions that patient had about telehealth interaction:  Yes.   I discussed the limitations, risks, security and privacy concerns of performing an evaluation and management service by telephone. I also discussed with the patient that there may be a patient responsible charge related to this service. The patient expressed understanding and agreed to proceed.  Patient Location: Home Provider Location: [facility])   ---------------------------------------------------------------------- Chief Complaint  Patient presents with  . Diabetes    S: Reviewed CMA documentation. I have called patient and gathered additional HPI as follows:  EXPOSURE TO COVID19 Recent exposure to [place] at work, fairly close indirect contact, always was wearing mask, no direct contact. Coworkers are out of work now due to positive test. She is asymptomatic. No new concerns. She presented here today but was asked to go back to car for virtual visit due to exposure. - Agrees to testing for COVID Denies any fevers, chills, sweats, body ache, cough, shortness of breath, sinus pain or pressure, headache, abdominal pain, diarrhea  CHRONIC DM, Type 2/ Morbid Obesity BMI >38 w/ comorbidities Doing well overall. A1c previous 6.7 to 7 on GLP1 ozempic Admits some lapse in diet plan some increased sugars now. CBGs: Avg150, Low>100 (with occasional mild symptoms but no actual hypoglycemia), High<200. Checks CBGs 1-2x every day Meds:Ozempic 0.561m [place] weekly inj,Metformin 10047mID - Recently some worse nausea 3 days after dose had one vomiting - Recently met deductible, cost of ozempic down to $25 Currently on ARB (Losartan half tab dose 12.61m38mLifestyle: -  Weightstable without significant loss - Diet (recently due to [place] less regular diet plan - admits needs to get back) - Exercise (Goal to resume yoga for exercise again, helps her mood as well) - [email or web address] Denies hypoglycemia, polyuria, visual changes, numbness or tingling   -------------------------------------------------------------------------- O: No physical exam performed due to remote telephone encounter.  -------------------------------------------------------------------------- A&P:  #Exposure to [place] today from face to face to virtual after triage at office Asymptomatic, afebrile Indirect close contact exposure, while wearing masks Coworker recently out of work now this week Advised patient to get COVID19 testing, [facility] After test result, can return to office for face to face as planned  Problem List Items Addressed This Visit    Type 2 diabetes mellitus with other specified complication ([facility]    Previously improved A1c DM control, now some elevated sugar readings, diet not as improved currently. Complications - other including hyperlipidemia, depression, morbid obesity - increases risk of future cardiovascular complications and poor glucose control due to reduced lifestyle diet/exercise with low energy mood and fatigue  Plan:  1. INCREASE Ozempic from 0.5 to 1mg49mekly inj - new rx sent, 90 day, despite nausea, she agree to try higher dose, will order Zofran ODT PRN for 1 day of nausea after taking med. Offered switch to other GLP1 Bydureon with less nausea, declines will reconsider in future - REDUCE Metformin from 1000 BID down to 500mg261m for now, future consider adjust or stop 2. Encourage improved lifestyle - may continue low carb, low sugar diet, reduce portion size, continue improving regular exercise 3. Check CBG, bring log to next visit for  review 4.  Continue ARB  F/u return f-f visit 2 weeks for DM Foot, A1c, recommend DM Eye exam      Relevant Medications   Semaglutide, 1 MG/DOSE, (OZEMPIC, 1 MG/DOSE,) 2 MG/1.5ML SOPN   metFORMIN (GLUCOPHAGE) 500 MG tablet    Other Visit Diagnoses    Nausea       Relevant Medications   ondansetron (ZOFRAN ODT) 4 MG disintegrating tablet   Exposure to COVID-19 virus            Meds ordered this encounter  Medications  . Semaglutide, 1 MG/DOSE, (OZEMPIC, 1 MG/DOSE,) 2 MG/1.5ML SOPN    Sig: Inject 1 mg into the skin once a week.    Dispense:  6 pen    Refill:  1    Dose increase from 0.5 to 28m  . ondansetron (ZOFRAN ODT) 4 MG disintegrating tablet    Sig: Take 1 tablet (4 mg total) by mouth every 8 (eight) hours as needed for nausea or vomiting.    Dispense:  30 tablet    Refill:  0     Patient verbalizes understanding with the above medical recommendations including the limitation of remote medical advice.  Specific follow-up / call-back criteria were given for patient to follow-up or seek medical care more urgently if needed.   - Time spent in direct consultation with patient on phone: 9 minutes   ANobie Putnam [facility] 03/28/2019, 8:13 AM

## 2019-03-28 NOTE — Assessment & Plan Note (Signed)
Previously improved A1c DM control, now some elevated sugar readings, diet not as improved currently. Complications - other including hyperlipidemia, depression, morbid obesity - increases risk of future cardiovascular complications and poor glucose control due to reduced lifestyle diet/exercise with low energy mood and fatigue  Plan:  1. INCREASE Ozempic from 0.5 to 1mg  weekly inj - new rx sent, 90 day, despite nausea, she agree to try higher dose, will order Zofran ODT PRN for 1 day of nausea after taking med. Offered switch to other GLP1 Bydureon with less nausea, declines will reconsider in future - REDUCE Metformin from 1000 BID down to 500mg  BID for now, future consider adjust or stop 2. Encourage improved lifestyle - may continue low carb, low sugar diet, reduce portion size, continue improving regular exercise 3. Check CBG, bring log to next visit for review 4. Continue ARB  F/u return f-f visit 2 weeks for DM Foot, A1c, recommend DM Eye exam

## 2019-03-30 LAB — NOVEL CORONAVIRUS, NAA: SARS-CoV-2, NAA: NOT DETECTED

## 2019-04-12 ENCOUNTER — Other Ambulatory Visit: Payer: Self-pay | Admitting: Family Medicine

## 2019-04-12 ENCOUNTER — Ambulatory Visit (INDEPENDENT_AMBULATORY_CARE_PROVIDER_SITE_OTHER): Payer: BC Managed Care – PPO | Admitting: Family Medicine

## 2019-04-12 ENCOUNTER — Other Ambulatory Visit: Payer: Self-pay

## 2019-04-12 ENCOUNTER — Encounter: Payer: Self-pay | Admitting: Family Medicine

## 2019-04-12 VITALS — BP 129/77 | HR 90 | Temp 97.5°F | Resp 16 | Ht 66.0 in | Wt 236.0 lb

## 2019-04-12 DIAGNOSIS — E1169 Type 2 diabetes mellitus with other specified complication: Secondary | ICD-10-CM | POA: Diagnosis not present

## 2019-04-12 DIAGNOSIS — Z23 Encounter for immunization: Secondary | ICD-10-CM | POA: Diagnosis not present

## 2019-04-12 DIAGNOSIS — F3341 Major depressive disorder, recurrent, in partial remission: Secondary | ICD-10-CM

## 2019-04-12 DIAGNOSIS — Z0000 Encounter for general adult medical examination without abnormal findings: Secondary | ICD-10-CM

## 2019-04-12 DIAGNOSIS — M1A0690 Idiopathic chronic gout, unspecified knee, without tophus (tophi): Secondary | ICD-10-CM

## 2019-04-12 DIAGNOSIS — E785 Hyperlipidemia, unspecified: Secondary | ICD-10-CM

## 2019-04-12 LAB — POCT GLYCOSYLATED HEMOGLOBIN (HGB A1C): Hemoglobin A1C: 6.7 % — AB (ref 4.0–5.6)

## 2019-04-12 NOTE — Assessment & Plan Note (Signed)
Clinically with morbid obesity despite BMI >38 - with co morbid conditions Type 2 Diabetes, Hyperlipidemia, and Depression  Wt loss on GLP1 but intolerance side effect - Encourage lifestyle improvement - continue diet and exercise

## 2019-04-12 NOTE — Assessment & Plan Note (Signed)
Improved A1c to 6.7 on GLP1 now higher dose, but had side effect GI intolerance Weight loss with improvement on GLP1 Complications - other including hyperlipidemia, depression, morbid obesity - increases risk of future cardiovascular complications and poor glucose control due to reduced lifestyle diet/exercise with low energy mood and fatigue  Plan:  1. Discussed concern with significant GI intolerance side effect on first dose of 1mg  weekly  - agree to trial at least 1 more dose of 1mg , then we can consider reducing back down to 0.5 if unable to tolerate. Gave a sample pen of the 0.5mg  dose today for up to 4 weeks of use if needed. She can notify us if needs new rx 0.5 sent in in future and DC 1mg  dose. Also we can consider alternate day dosing on 0.5mg  if this would be approved. - Or switch to Bydureon or Trulicity for less side effects as possibility but she has had good appetite suppression and wt loss as well but some nausea and other side effect have been severe - Continue Metformin from 500 BID - future reduce or stop 2. Encourage improved lifestyle - may continue low carb, low sugar diet, reduce portion size, continue improving regular exercise 3. Check CBG, bring log to next visit for review 4. Continue ARB - Request record [facility] [place] [facility] for DM Eye exam 09/2018  F/u 3 months physical

## 2019-04-12 NOTE — Progress Notes (Signed)
Subjective:    Patient ID: Diana Cunningham, female    DOB: October 15, 1980, 38 y.o.   MRN: [id number]  Diana Cunningham is a 38 y.o. female presenting on 04/12/2019 for Diabetes   HPI   CHRONIC DM, Type 2/ Morbid Obesity BMI >38 w/ comorbidities Remains on Ozempic. Last visit A1c 7 - due today, last visit increased ozempic from 0.5 up to 1, with side effect CBGs: Avg100-120, Low>90 (with occasional mild symptoms but no actual hypoglycemia), High<200. Checks CBGs 1-2x every day Meds:Ozempic 1mg  [place] weekly inj,Metformin 1000mg BID side effect with feeling overfull and nausea even though didn't eat as much then felt sick and nausea Worse nausea 2-3 days after, weight loss 5 lbs in 1 week Currently on ARB (Losartan half tab dose 12.5mg ) Lifestyle: Weight down - Diet (recently due to [place] pandemic less regular diet plan - admits needs to get back) - Exercise (Goal to resume yoga for exercise again, helps her mood as well) - Last ophthalmology exam, [place] 09/2018 - request record Denies hypoglycemia, polyuria, visual changes, numbness or tingling  Health Maintenance: Due flu vaccine.  Depression screen [facility] 2/9 03/28/2019 06/22/2018 05/18/2018  Decreased Interest 0 0 0  Down, Depressed, Hopeless 0 0 0  PHQ - 2 Score 0 0 0  Altered sleeping 0 1 0  Tired, decreased energy 0 1 0  Change in appetite 0 0 0  Feeling bad or failure about yourself  0 0 0  Trouble concentrating 0 0 0  Moving slowly or fidgety/restless 0 0 0  Suicidal thoughts 0 0 0  PHQ-9 Score 0 2 0  Difficult doing work/chores Not difficult at all - -    Social History   Tobacco Use  . Smoking status: Former Smoker    Packs/day: 1.00    Years: 1.00    Pack years: 1.00    Types: Cigarettes    Quit date: 05/10/1999    Years since quitting: 19.9  . Smokeless tobacco: Former Network engineer Use Topics  . Alcohol use: Yes    Alcohol/week: 0.0 standard drinks    Comment: on occasion  . Drug  use: No    Review of Systems Per HPI unless specifically indicated above     Objective:    BP 129/77   Pulse 90   Temp (!) 97.5 F (36.4 C) (Oral)   Resp 16   Ht 5\' 6"  (1.676 m)   Wt 236 lb (107 kg)   BMI 38.09 kg/m   Wt Readings from Last 3 Encounters:  04/12/19 236 lb (107 kg)  06/22/18 241 lb 9.6 oz (109.6 kg)  05/18/18 241 lb (109.3 kg)    Physical Exam Vitals signs and nursing note reviewed.  Constitutional:      General: She is not in acute distress.    Appearance: She is well-developed. She is not diaphoretic.     Comments: Well-appearing, comfortable, cooperative  HENT:     Head: Normocephalic and atraumatic.  Eyes:     General:        Right eye: No discharge.        Left eye: No discharge.     Conjunctiva/sclera: Conjunctivae normal.  Cardiovascular:     Rate and Rhythm: Normal rate.  Pulmonary:     Effort: Pulmonary effort is normal.  Skin:    General: Skin is warm and dry.     Findings: No erythema or rash.  Neurological:     Mental Status: She is alert and  oriented to person, place, and time.  Psychiatric:        Behavior: Behavior normal.     Comments: Well groomed, good eye contact, normal speech and thoughts      Diabetic Foot Exam - Simple   Simple Foot Form Diabetic Foot exam was performed with the following findings: Yes 04/12/2019  8:32 AM  Visual Inspection See comments: Yes Sensation Testing Intact to touch and monofilament testing bilaterally: Yes Pulse Check Posterior Tibialis and Dorsalis pulse intact bilaterally: Yes Comments Mild to moderate callus of left heel, without ulceration, otherwise normal.    Recent Labs    05/11/18 0749 04/12/19 0824  HGBA1C 7.0* 6.7*     Results for orders placed or performed in visit on 04/12/19  POCT HgB A1C  Result Value Ref Range   Hemoglobin A1C 6.7 (A) 4.0 - 5.6 %      Assessment & Plan:   Problem List Items Addressed This Visit    Type 2 diabetes mellitus with other specified  complication ([place]) - Primary    Improved A1c to 6.7 on GLP1 now higher dose, but had side effect GI intolerance Weight loss with improvement on GLP1 Complications - other including hyperlipidemia, depression, morbid obesity - increases risk of future cardiovascular complications and poor glucose control due to reduced lifestyle diet/exercise with low energy mood and fatigue  Plan:  1. Discussed concern with significant GI intolerance side effect on first dose of 1mg  weekly  - agree to trial at least 1 more dose of 1mg , then we can consider reducing back down to 0.5 if unable to tolerate. Gave a sample pen of the 0.5mg  dose today for up to 4 weeks of use if needed. She can notify us if needs new rx 0.5 sent in in future and DC 1mg  dose. Also we can consider alternate day dosing on 0.5mg  if this would be approved. - Or switch to Bydureon or Trulicity for less side effects as possibility but she has had good appetite suppression and wt loss as well but some nausea and other side effect have been severe - Continue Metformin from 500 BID - future reduce or stop 2. Encourage improved lifestyle - may continue low carb, low sugar diet, reduce portion size, continue improving regular exercise 3. Check CBG, bring log to next visit for review 4. Continue ARB - Request record [facility] [place] [facility] for DM Eye exam 09/2018  F/u 3 months physical      Relevant Orders   POCT HgB A1C (Completed)   Morbid obesity ([place])    Clinically with morbid obesity despite BMI >38 - with co morbid conditions Type 2 Diabetes, Hyperlipidemia, and Depression  Wt loss on GLP1 but intolerance side effect - Encourage lifestyle improvement - continue diet and exercise       Other Visit Diagnoses    Needs flu shot       Relevant Orders   [facility] - Flu Vaccine QUAD 36+ mos PF IM (Fluarix & Fluzone Quad PF) (Completed)        No orders of the defined types were placed in this encounter.     Follow up plan: Return  in about 3 months (around 07/11/2019) for Annual Physical.  Future labs ordered for 07/05/19  Nobie Putnam, DO [facility] 04/12/2019, 8:20 AM

## 2019-04-12 NOTE — Patient Instructions (Addendum)
Thank you for coming to the office today.  Continue with Ozempic 1mg  for another 1-2 weeks to try it out longer, hopefully side effect will subside.  If not able to tolerate side effect, reduce back down to the 0.5 dose once weekly.  Can consider 0.5mg  twice a week in future if need.  DUE for FASTING BLOOD WORK (no food or drink after midnight before the lab appointment, only water or coffee without cream/sugar on the morning of)  SCHEDULE "Lab Only" visit in the morning at the clinic for lab draw in 3 MONTHS   - Make sure Lab Only appointment is at about 1 week before your next appointment, so that results will be available  For Lab Results, once available within 2-3 days of blood draw, you can can log in to MyChart online to view your results and a brief explanation. Also, we can discuss results at next follow-up visit.   Please schedule a Follow-up Appointment to: Return in about 3 months (around 07/11/2019) for Annual Physical.  If you have any other questions or concerns, please feel free to call the office or send a message through [place]. You may also schedule an earlier appointment if necessary.  Additionally, you may be receiving a survey about your experience at our office within a few days to 1 week by e-mail or mail. We value your feedback.  Nobie Putnam, DO [place]

## 2019-05-02 DIAGNOSIS — Z20828 Contact with and (suspected) exposure to other viral communicable diseases: Secondary | ICD-10-CM | POA: Diagnosis not present

## 2019-05-07 ENCOUNTER — Other Ambulatory Visit: Payer: Self-pay

## 2019-05-07 DIAGNOSIS — R110 Nausea: Secondary | ICD-10-CM

## 2019-05-07 MED ORDER — ONDANSETRON 4 MG PO TBDP: 4.0000 mg | ORAL_TABLET | Freq: Three times a day (TID) | ORAL | 0 refills | Status: DC | PRN

## 2019-07-05 ENCOUNTER — Other Ambulatory Visit: Payer: BC Managed Care – PPO

## 2019-07-05 ENCOUNTER — Other Ambulatory Visit: Payer: Self-pay

## 2019-07-05 DIAGNOSIS — E1169 Type 2 diabetes mellitus with other specified complication: Secondary | ICD-10-CM | POA: Diagnosis not present

## 2019-07-05 DIAGNOSIS — M1A0690 Idiopathic chronic gout, unspecified knee, without tophus (tophi): Secondary | ICD-10-CM | POA: Diagnosis not present

## 2019-07-05 DIAGNOSIS — E785 Hyperlipidemia, unspecified: Secondary | ICD-10-CM

## 2019-07-05 DIAGNOSIS — F3341 Major depressive disorder, recurrent, in partial remission: Secondary | ICD-10-CM

## 2019-07-05 DIAGNOSIS — Z0000 Encounter for general adult medical examination without abnormal findings: Secondary | ICD-10-CM | POA: Diagnosis not present

## 2019-07-06 LAB — CBC WITH DIFFERENTIAL/PLATELET
Absolute Monocytes: 493 {cells}/uL (ref 200–950)
Basophils Absolute: 39 {cells}/uL (ref 0–200)
Basophils Relative: 0.5 %
Eosinophils Absolute: 354 {cells}/uL (ref 15–500)
Eosinophils Relative: 4.6 %
HCT: 38.5 % (ref 35.0–45.0)
Hemoglobin: 13.3 g/dL (ref 11.7–15.5)
Lymphs Abs: 2056 {cells}/uL (ref 850–3900)
MCH: 29.8 pg (ref 27.0–33.0)
MCHC: 34.5 g/dL (ref 32.0–36.0)
MCV: 86.3 fL (ref 80.0–100.0)
MPV: 11.2 fL (ref 7.5–12.5)
Monocytes Relative: 6.4 %
Neutro Abs: 4759 {cells}/uL (ref 1500–7800)
Neutrophils Relative %: 61.8 %
Platelets: 248 Thousand/uL (ref 140–400)
RBC: 4.46 Million/uL (ref 3.80–5.10)
RDW: 13.3 % (ref 11.0–15.0)
Total Lymphocyte: 26.7 %
WBC: 7.7 Thousand/uL (ref 3.8–10.8)

## 2019-07-06 LAB — COMPLETE METABOLIC PANEL WITH GFR
AG Ratio: 1.5 (calc) (ref 1.0–2.5)
ALT: 22 U/L (ref 6–29)
AST: 17 U/L (ref 10–30)
Albumin: 4.3 g/dL (ref 3.6–5.1)
Alkaline phosphatase (APISO): 102 U/L (ref 31–125)
BUN: 12 mg/dL (ref 7–25)
CO2: 30 mmol/L (ref 20–32)
Calcium: 9.4 mg/dL (ref 8.6–10.2)
Chloride: 98 mmol/L (ref 98–110)
Creat: 0.55 mg/dL (ref 0.50–1.10)
GFR, Est African American: 138 mL/min/{1.73_m2} (ref >=60)
GFR, Est Non African American: 119 mL/min/{1.73_m2} (ref >=60)
Globulin: 2.8 g/dL (calc) (ref 1.9–3.7)
Glucose, Bld: 145 mg/dL — ABNORMAL HIGH (ref 65–99)
Potassium: 4.2 mmol/L (ref 3.5–5.3)
Sodium: 136 mmol/L (ref 135–146)
Total Bilirubin: 0.6 mg/dL (ref 0.2–1.2)
Total Protein: 7.1 g/dL (ref 6.1–8.1)

## 2019-07-06 LAB — LIPID PANEL
Cholesterol: 184 mg/dL (ref <200)
HDL: 39 mg/dL — ABNORMAL LOW (ref >=50)
LDL Cholesterol (Calc): 108 mg/dL (calc) — ABNORMAL HIGH
Non-HDL Cholesterol (Calc): 145 mg/dL (calc) — ABNORMAL HIGH (ref <130)
Total CHOL/HDL Ratio: 4.7 (calc) (ref <5.0)
Triglycerides: 241 mg/dL — ABNORMAL HIGH (ref <150)

## 2019-07-06 LAB — URIC ACID: Uric Acid, Serum: 5.6 mg/dL (ref 2.5–7.0)

## 2019-07-06 LAB — HEMOGLOBIN A1C
Hgb A1c MFr Bld: 6.7 % of total Hgb — ABNORMAL HIGH (ref <5.7)
Mean Plasma Glucose: 146 (calc)
eAG (mmol/L): 8.1 (calc)

## 2019-07-06 LAB — TSH: TSH: 1.2 m[IU]/L

## 2019-07-12 ENCOUNTER — Other Ambulatory Visit: Payer: Self-pay

## 2019-07-12 ENCOUNTER — Ambulatory Visit (INDEPENDENT_AMBULATORY_CARE_PROVIDER_SITE_OTHER): Payer: BC Managed Care – PPO | Admitting: Family Medicine

## 2019-07-12 ENCOUNTER — Encounter: Payer: Self-pay | Admitting: Family Medicine

## 2019-07-12 VITALS — BP 130/54 | HR 96 | Temp 97.9°F | Resp 16 | Ht 66.0 in | Wt 234.0 lb

## 2019-07-12 DIAGNOSIS — F3342 Major depressive disorder, recurrent, in full remission: Secondary | ICD-10-CM | POA: Diagnosis not present

## 2019-07-12 DIAGNOSIS — E1169 Type 2 diabetes mellitus with other specified complication: Secondary | ICD-10-CM

## 2019-07-12 DIAGNOSIS — E785 Hyperlipidemia, unspecified: Secondary | ICD-10-CM

## 2019-07-12 DIAGNOSIS — Z0000 Encounter for general adult medical examination without abnormal findings: Secondary | ICD-10-CM | POA: Diagnosis not present

## 2019-07-12 DIAGNOSIS — M1A0690 Idiopathic chronic gout, unspecified knee, without tophus (tophi): Secondary | ICD-10-CM

## 2019-07-12 DIAGNOSIS — J302 Other seasonal allergic rhinitis: Secondary | ICD-10-CM

## 2019-07-12 MED ORDER — MONTELUKAST SODIUM 10 MG PO TABS: 10.0000 mg | ORAL_TABLET | Freq: Every day | ORAL | 3 refills | Status: DC

## 2019-07-12 NOTE — Assessment & Plan Note (Signed)
Controlled LDL but slightly increased. Elevated TG still but improved Last lipid panel 06/2019 Calculated ASCVD 10 yr risk score low risk  Plan: 1. Discussed ASCVD risk - Recommend Fish Oil omega 3 OTC 1-2g BID wc 2. Encourage improved lifestyle - low carb/cholesterol, reduce portion size, continue improving regular exercise

## 2019-07-12 NOTE — Assessment & Plan Note (Signed)
Stable, controlled mood on SSRI Some underlying insomnia and tired, remains stable PHQ 0 No current psychiatry  Plan Continue current Escitalopram 20mg  daily - continue current therapy may adjust in future if need Follow-up

## 2019-07-12 NOTE — Patient Instructions (Addendum)
Thank you for coming to the office today.  Recent Labs    04/12/19 0824 07/05/19 0759  HGBA1C 6.7* 6.7*    Consider Fish Oil Omega 3 supplement 1000mg  capsules can take 2 at once with meal, up to maximum twice a day.  Keep up the great work overall  Start Singulair 10mg  nightly, keep on claritin. If need can add flonase  If need - Start nasal steroid Flonase 2 sprays in each nostril daily for 4-6 weeks, may repeat course seasonally or as needed    Please schedule a Follow-up Appointment to: Return in about 6 months (around 01/12/2020) for 6 month follow-up DM A1c.  If you have any other questions or concerns, please feel free to call the office or send a message through [place]. You may also schedule an earlier appointment if necessary.  Additionally, you may be receiving a survey about your experience at our office within a few days to 1 week by e-mail or mail. We value your feedback.  Nobie Putnam, DO [place]

## 2019-07-12 NOTE — Assessment & Plan Note (Signed)
Stable controlled DM A1c 6.7 on GLP1 Weight loss with improvement on GLP1 Complications - other including hyperlipidemia, depression, morbid obesity - increases risk of future cardiovascular complications and poor glucose control due to reduced lifestyle diet/exercise with low energy mood and fatigue  Plan:  1. Continue Ozempic 1mg  weekly inj - now tolerating better. - Continue Metformin from 500 BID - future reduce or stop 2. Encourage improved lifestyle - may continue low carb, low sugar diet, reduce portion size, continue improving regular exercise 3. Check CBG, bring log to next visit for review 4. Continue ARB - F/u [facility] [place] [facility] Harle Stanford for DM Eye exam 09/2019  6 mo

## 2019-07-12 NOTE — Assessment & Plan Note (Signed)
Withoutu flare since 2017 Controlled Uric acid < 2 now Not on med

## 2019-07-12 NOTE — Progress Notes (Signed)
Subjective:    Patient ID: Diana Cunningham, female    DOB: [birth year], 39 y.o.   MRN: [id number]  Diana Cunningham is a 39 y.o. female presenting on 07/12/2019 for Annual Exam   HPI   Here for Annual Physical and Lab Review.   CHRONIC DM, Type 2/ Morbid Obesity BMI >37 w/ comorbidities Last trend A1c 7 to 6.7, last lab now 6.7. Improved on GLP1 CBGs: Avg100-120, Low>90 (with occasional mild symptoms but no actual hypoglycemia), High<180. Checks CBGs 1-2x every day Meds:Ozempic 70m [place] weekly inj,Metformin 10070mID Tolerating med well, no further nausea side effect Currently on ARB (Losartan half tab dose 12.16m82mLifestyle: Weight down >5 lbs since last visit - Diet (goals to improve diet healthy options low carb) - Exercise (Goal to resumeyoga for exercise again, helps her mood as well) - [place] [place] Denies hypoglycemia, polyuria, visual changes, numbness or tingling  HYPERLIPIDEMIA: - Reports no concerns. Last lipid panel 05/2019, controlled but slightly elevated LDL 108, and TG still elevated 240s but improved. Not on statin or fish oil.  DEPRESSION, Major - In Remission: Today doing well. Says symptoms resolved on med Escitalopram 48m73mily. Tolerating well without side effects Never on other medications. Never established with Psychiatry or Psychology therapy. Previously in 05/2016 she was increased from Escitalopram 10 to 48mg40mly  Additional concern  Gout No flares in several years. Last lab Uric Acid < 2  Seasonal Allergies Flared up Reports watery eyes itchy, nasal congestion, drainage sore throat at times. Using claritin if miss dose has worse symptoms, now this season is worse.  Health Maintenance:  Cervical Cancer Screening: UTD Last pap 05/2018. No fam history of cervical or GYN cancer.  No fam history of colon cancer.  UTD Pneumonia vaccine - in setting of diabetes, before age 62.  81D Flu  vaccine 04/2019   Depression screen PHQ [facility]2019 03/28/2019 06/22/2018  Decreased Interest 0 0 0  Down, Depressed, Hopeless 0 0 0  PHQ - 2 Score 0 0 0  Altered sleeping 0 0 1  Tired, decreased energy 0 0 1  Change in appetite 0 0 0  Feeling bad or failure about yourself  0 0 0  Trouble concentrating 0 0 0  Moving slowly or fidgety/restless 0 0 0  Suicidal thoughts 0 0 0  PHQ-9 Score 0 0 2  Difficult doing work/chores Not difficult at all Not difficult at all -    Past Medical History:  Diagnosis Date  . Pilonidal cyst    Past Surgical History:  Procedure Laterality Date  . PILONIDAL CYST EXCISION  2005   Social History   Socioeconomic History  . Marital status: Single    Spouse name: Not on file  . Number of children: Not on file  . Years of education: Not on file  . Highest education level: Not on file  Occupational History  . Not on file  Tobacco Use  . Smoking status: Former Smoker    Packs/day: 1.00    Years: 1.00    Pack years: 1.00    Types: Cigarettes    Quit date: 05/10/1999    Years since quitting: 20.1  . Smokeless tobacco: Former User Network engineerSexual Activity  . Alcohol use: Yes    Alcohol/week: 0.0 standard drinks    Comment: on occasion  . Drug use: No  . Sexual activity: Yes  Other Topics Concern  . Not on file  Social History Narrative  .  Not on file   Social Determinants of Health   Financial Resource Strain:   . Difficulty of Paying Living Expenses: Not on file  Food Insecurity:   . Worried About Charity fundraiser in the Last Year: Not on file  . Ran Out of Food in the Last Year: Not on file  Transportation Needs:   . Lack of Transportation (Medical): Not on file  . Lack of Transportation (Non-Medical): Not on file  Physical Activity:   . Days of Exercise per Week: Not on file  . Minutes of Exercise per Session: Not on file  Stress:   . Feeling of Stress : Not on file  Social Connections:   . Frequency of Communication  with Friends and Family: Not on file  . Frequency of Social Gatherings with Friends and Family: Not on file  . Attends Religious Services: Not on file  . Active Member of Clubs or Organizations: Not on file  . Attends Archivist Meetings: Not on file  . Marital Status: Not on file  Intimate Partner Violence:   . Fear of Current or Ex-Partner: Not on file  . Emotionally Abused: Not on file  . Physically Abused: Not on file  . Sexually Abused: Not on file   Family History  Problem Relation Age of Onset  . Hypertension Mother   . Depression Mother   . Basal cell carcinoma Mother   . Breast cancer Neg Hx   . Colon cancer Neg Hx    Current Outpatient Medications on File Prior to Visit  Medication Sig  . Blood Glucose Monitoring Suppl (ONE TOUCH ULTRA 2) w/Device KIT OneTouch Ultra2 Meter kit  TK UTD BID  . escitalopram (LEXAPRO) 20 MG tablet TAKE 1 TABLET(20 MG) BY MOUTH DAILY  . Insulin Pen Needle (NOVOFINE PLUS) 32G X 4 MM MISC Use with Ozempic weekly injection as instructed  . Loratadine (CLARITIN PO) Claritin  . losartan (COZAAR) 25 MG tablet TAKE 1/2 TABLET(12.5 MG) BY MOUTH DAILY  . meclizine (ANTIVERT) 25 MG tablet Take 1 tablet (25 mg total) by mouth 3 (three) times daily as needed for dizziness.  . metFORMIN (GLUCOPHAGE) 500 MG tablet Take 1 tablet (500 mg total) by mouth 2 (two) times daily with a meal.  . ondansetron (ZOFRAN ODT) 4 MG disintegrating tablet Take 1 tablet (4 mg total) by mouth every 8 (eight) hours as needed for nausea or vomiting.  Glory Rose[place]ush DELICA LANCETS 54Y MISC U UTD BID  . Semaglutide, 1 MG/DOSE, (OZEMPIC, 1 MG/DOSE,) 2 MG/1.5ML SOPN Inject 1 mg into the skin once a week.   No current facility-administered medications on file prior to visit.    Review of Systems  Constitutional: Negative for activity change, appetite change, chills, diaphoresis, fatigue and fever.  HENT: Positive for postnasal drip. Negative for congestion and hearing loss.    Eyes: Positive for itching. Negative for discharge and visual disturbance.  Respiratory: Negative for apnea, cough, chest tightness, shortness of breath and wheezing.   Cardiovascular: Negative for chest pain, palpitations and leg swelling.  Gastrointestinal: Negative for abdominal pain, anal bleeding, blood in stool, constipation, diarrhea, nausea and vomiting.  Endocrine: Negative for cold intolerance.  Genitourinary: Negative for difficulty urinating, dysuria, frequency and hematuria.  Musculoskeletal: Negative for arthralgias, back pain and neck pain.  Skin: Negative for rash.  Allergic/Immunologic: Positive for environmental allergies.  Neurological: Negative for dizziness, weakness, light-headedness, numbness and headaches.  Hematological: Negative for adenopathy.  Psychiatric/Behavioral: Negative for behavioral problems, dysphoric  mood and sleep disturbance. The patient is not nervous/anxious.    Per HPI unless specifically indicated above      Objective:    BP (!) 130/54   Pulse 96   Temp 97.9 F (36.6 C) (Temporal)   Resp 16   Ht _0  (1.676 m)   Wt 234 lb (106.1 kg)   BMI 37.77 kg/m   Wt Readings from Last 3 Encounters:  07/12/19 234 lb (106.1 kg)  04/12/19 236 lb (107 kg)  06/22/18 241 lb 9.6 oz (109.6 kg)    Physical Exam Vitals and nursing note reviewed.  Constitutional:      General: She is not in acute distress.    Appearance: She is well-developed. She is obese. She is not diaphoretic.     Comments: Well-appearing, comfortable, cooperative  HENT:     Head: Normocephalic and atraumatic.  Eyes:     General:        Right eye: No discharge.        Left eye: No discharge.     Conjunctiva/sclera: Conjunctivae normal.     Pupils: Pupils are equal, round, and reactive to light.  Neck:     Thyroid: No thyromegaly.  Cardiovascular:     Rate and Rhythm: Normal rate and regular rhythm.     Heart sounds: Normal heart sounds. No murmur.  Pulmonary:      Effort: Pulmonary effort is normal. No respiratory distress.     Breath sounds: Normal breath sounds. No wheezing or rales.  Abdominal:     General: Bowel sounds are normal. There is no distension.     Palpations: Abdomen is soft. There is no mass.     Tenderness: There is no abdominal tenderness.  Musculoskeletal:        General: No tenderness. Normal range of motion.     Cervical back: Normal range of motion and neck supple.     Comments: Upper / Lower Extremities: - Normal muscle tone, strength bilateral upper extremities 5/5, lower extremities 5/5  Lymphadenopathy:     Cervical: No cervical adenopathy.  Skin:    General: Skin is warm and dry.     Findings: No erythema or rash.  Neurological:     Mental Status: She is alert and oriented to person, place, and time.     Comments: Distal sensation intact to light touch all extremities  Psychiatric:        Behavior: Behavior normal.     Comments: Well groomed, good eye contact, normal speech and thoughts    Results for orders placed or performed in visit on 07/05/19  Uric acid  Result Value Ref Range   Uric Acid, Serum 5.6 2.5 - 7.0 mg/dL  [facility] - TSH  Result Value Ref Range   TSH 1.20 mIU/L  [facility] - Lipid panel physical  Result Value Ref Range   Cholesterol 184 <200 mg/dL   HDL 39 (L) > OR = 50 mg/dL   Triglycerides 241 (H) <150 mg/dL   LDL Cholesterol (Calc) 108 (H) mg/dL (calc)   Total CHOL/HDL Ratio 4.7 <5.0 (calc)   Non-HDL Cholesterol (Calc) 145 (H) <130 mg/dL (calc)  [facility] - CMET w/ GFR CMP Complete Metabolic Panel physical  Result Value Ref Range   Glucose, Bld 145 (H) 65 - 99 mg/dL   BUN 12 7 - 25 mg/dL   Creat 0.55 0.50 - 1.10 mg/dL   GFR, Est Non African American 119 > OR = 60 mL/min/1.96m   GFR, Est African  American 138 > OR = 60 mL/min/1.31m   BUN/Creatinine Ratio NOT APPLICABLE 6 - 22 (calc)   Sodium 136 135 - 146 mmol/L   Potassium 4.2 3.5 - 5.3 mmol/L   Chloride 98 98 - 110 mmol/L   CO2 30 20 - 32  mmol/L   Calcium 9.4 8.6 - 10.2 mg/dL   Total Protein 7.1 6.1 - 8.1 g/dL   Albumin 4.3 3.6 - 5.1 g/dL   Globulin 2.8 1.9 - 3.7 g/dL (calc)   AG Ratio 1.5 1.0 - 2.5 (calc)   Total Bilirubin 0.6 0.2 - 1.2 mg/dL   Alkaline phosphatase (APISO) 102 31 - 125 U/L   AST 17 10 - 30 U/L   ALT 22 6 - 29 U/L  [facility] - CBC with Differential/Platelet physical  Result Value Ref Range   WBC 7.7 3.8 - 10.8 Thousand/uL   RBC 4.46 3.80 - 5.10 Million/uL   Hemoglobin 13.3 11.7 - 15.5 g/dL   HCT 38.5 35.0 - 45.0 %   MCV 86.3 80.0 - 100.0 fL   MCH 29.8 27.0 - 33.0 pg   MCHC 34.5 32.0 - 36.0 g/dL   RDW 13.3 11.0 - 15.0 %   Platelets 248 140 - 400 Thousand/uL   MPV 11.2 7.5 - 12.5 fL   Neutro Abs 4,759 1,500 - 7,800 cells/uL   Lymphs Abs 2,056 850 - 3,900 cells/uL   Absolute Monocytes 493 200 - 950 cells/uL   Eosinophils Absolute 354 15 - 500 cells/uL   Basophils Absolute 39 0 - 200 cells/uL   Neutrophils Relative % 61.8 %   Total Lymphocyte 26.7 %   Monocytes Relative 6.4 %   Eosinophils Relative 4.6 %   Basophils Relative 0.5 %  [facility] - A1c LAB Hemoglobin A1C physical  Result Value Ref Range   Hgb A1c MFr Bld 6.7 (H) <5.7 % of total Hgb   Mean Plasma Glucose 146 (calc)   eAG (mmol/L) 8.1 (calc)      Assessment & Plan:   Problem List Items Addressed This Visit    Type 2 diabetes mellitus with other specified complication (HCC)    Stable controlled DM A1c 6.7 on GLP1 Weight loss with improvement on GLP1 Complications - other including hyperlipidemia, depression, morbid obesity - increases risk of future cardiovascular complications and poor glucose control due to reduced lifestyle diet/exercise with low energy mood and fatigue  Plan:  1. Continue Ozempic 119mweekly inj - now tolerating better. - Continue Metformin from 500 BID - future reduce or stop 2. Encourage improved lifestyle - may continue low carb, low sugar diet, reduce portion size, continue improving regular exercise 3. Check CBG,  bring log to next visit for review 4. 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 Elevated TG still but improved Last  lipid panel 06/2019 Calculated ASCVD 10 yr risk score low risk  Plan: 1. Discussed ASCVD risk - Recommend Fish Oil omega 3 OTC 1-2g BID wc 2. Encourage improved lifestyle - low carb/cholesterol, reduce portion size, continue improving regular exercise      Gout    Withoutu flare since 2017 Controlled Uric acid < 2 now Not on med       Other Visit Diagnoses    Annual physical exam    -  Primary   Seasonal allergies       Relevant Medications   montelukast (SINGULAIR) 10 MG tablet    Continue claritin. Add singulair. Future consider Flonase.   Updated Health Maintenance information Reviewed recent lab results with patient Encouraged improvement to lifestyle with diet and exercise - Goal of weight loss   Meds ordered this encounter  Medications  . montelukast (SINGULAIR) 10 MG tablet    Sig: Take 1 tablet (10 mg total) by mouth at bedtime.    Dispense:  90 tablet    Refill:  3      Follow up plan: Return in about 6 months (around 01/12/2020) for 6 month follow-up DM A1c.  Nobie Putnam, [facility] 07/12/2019, 8:36 AM

## 2019-07-12 NOTE — Assessment & Plan Note (Signed)
Clinically with morbid obesity despite BMI >38 - with co morbid conditions Type 2 Diabetes, Hyperlipidemia, and Depression  Wt loss on GLP1 - Encourage lifestyle improvement - continue diet and exercise

## 2019-08-21 ENCOUNTER — Other Ambulatory Visit: Payer: Self-pay | Admitting: Family Medicine

## 2019-08-21 DIAGNOSIS — R110 Nausea: Secondary | ICD-10-CM

## 2019-08-21 NOTE — Telephone Encounter (Signed)
Requested  medications are  due for refill today yes  Requested medications are on the active medication list yes  Last refill 05/07/2019  Last visit - 07/12/2019  Notes to clinic Not Delegated

## 2019-09-09 LAB — HM DIABETES EYE EXAM

## 2019-09-17 ENCOUNTER — Other Ambulatory Visit: Payer: Self-pay | Admitting: Family Medicine

## 2019-09-17 DIAGNOSIS — E1169 Type 2 diabetes mellitus with other specified complication: Secondary | ICD-10-CM

## 2019-10-06 ENCOUNTER — Other Ambulatory Visit: Payer: Self-pay | Admitting: Family Medicine

## 2019-10-06 DIAGNOSIS — F3341 Major depressive disorder, recurrent, in partial remission: Secondary | ICD-10-CM

## 2019-10-06 NOTE — Telephone Encounter (Signed)
Requested Prescriptions  Pending Prescriptions Disp Refills  . escitalopram (LEXAPRO) 20 MG tablet [Pharmacy Med Name: ESCITALOPRAM 20MG  TABLETS] 90 tablet 1    Sig: TAKE 1 TABLET(20 MG) BY MOUTH DAILY     Psychiatry:  Antidepressants - SSRI Passed - 10/06/2019  3:27 AM      Passed - Completed PHQ-2 or PHQ-9 in the last 360 days.      Passed - Valid encounter within last 6 months    Recent Outpatient Visits          2 months ago Annual physical exam   [place], DO   5 months ago Type 2 diabetes mellitus with other specified complication, without long-term current use of insulin [facility])   [place], DO   6 months ago Type 2 diabetes mellitus with other specified complication, without long-term current use of insulin [facility])   [place], DO   1 year ago Irregular menstrual cycle   [facility] Olin Hauser, DO   1 year ago Annual physical exam   [facility] Olin Hauser, DO

## 2019-11-03 ENCOUNTER — Other Ambulatory Visit: Payer: Self-pay | Admitting: Family Medicine

## 2019-11-03 DIAGNOSIS — E119 Type 2 diabetes mellitus without complications: Secondary | ICD-10-CM

## 2019-11-03 DIAGNOSIS — E1169 Type 2 diabetes mellitus with other specified complication: Secondary | ICD-10-CM

## 2019-11-03 NOTE — Telephone Encounter (Signed)
Requested Prescriptions  Pending Prescriptions Disp Refills  . metFORMIN (GLUCOPHAGE) 500 MG tablet [Pharmacy Med Name: METFORMIN 500MG TABLETS] 360 tablet     Sig: TAKE 2 TABLETS(1000 MG) BY MOUTH TWICE DAILY WITH A MEAL     Endocrinology:  Diabetes - Biguanides Passed - 11/03/2019  3:27 AM      Passed - Cr in normal range and within 360 days    Creat  Date Value Ref Range Status  07/05/2019 0.55 0.50 - 1.10 mg/dL Final         Passed - HBA1C is between 0 and 7.9 and within 180 days    Hgb A1c MFr Bld  Date Value Ref Range Status  07/05/2019 6.7 (H) <5.7 % of total Hgb Final    Comment:    For someone without known diabetes, a hemoglobin A1c value of 6.5% or greater indicates that they may have  diabetes and this should be confirmed with a follow-up  test. . For someone with known diabetes, a value <7% indicates  that their diabetes is well controlled and a value  greater than or equal to 7% indicates suboptimal  control. A1c targets should be individualized based on  duration of diabetes, age, comorbid conditions, and  other considerations. . Currently, no consensus exists regarding use of hemoglobin A1c for diagnosis of diabetes for children. .          Passed - eGFR in normal range and within 360 days    GFR, Est African American  Date Value Ref Range Status  07/05/2019 138 > OR = 60 mL/min/1.61m Final   GFR, Est Non African American  Date Value Ref Range Status  07/05/2019 119 > OR = 60 mL/min/1.761mFinal         Passed - Valid encounter within last 6 months    Recent Outpatient Visits          3 months ago Annual physical [facility]   [facility]   6 months ago Type 2 diabetes mellitus with other specified complication, without long-term current use of insulin ([facility]CL[facility] [place]  [facility]   7 months ago Type 2 diabetes mellitus with other specified complication, without long-term  current use of insulin (HCG[place]  [place]   1 year ago Irregular menstrual cycle   [facility]   1 year ago Annual physical exam   [facility]             . losartan (COZAAR) 25 MG tablet [Pharmacy Med Name: LOSARTAN 25MG TABLETS] 45 tablet 1    Sig: TAKE 1/2 TABLET(12.5 MG) BY MOUTH DAILY     Cardiovascular:  Angiotensin Receptor Blockers Passed - 11/03/2019  3:27 AM      Passed - Cr in normal range and within 180 days    Creat  Date Value Ref Range Status  07/05/2019 0.55 0.50 - 1.10 mg/dL Final         Passed - K in normal range and within 180 days    Potassium  Date Value Ref Range Status  07/05/2019 4.2 3.5 - 5.3 mmol/L Final         Passed - Patient is not pregnant      Passed - Last BP in normal range    BP Readings from Last 1 Encounters:  07/12/19 (!) 130/54  Passed - Valid encounter within last 6 months    Recent Outpatient Visits          3 months ago Annual physical exam   [facility], Devonne Doughty, DO   6 months ago Type 2 diabetes mellitus with other specified complication, without long-term current use of insulin [facility])   [place], DO   7 months ago Type 2 diabetes mellitus with other specified complication, without long-term current use of insulin [facility])   [place], DO   1 year ago Irregular menstrual cycle   [facility] Olin Hauser, DO   1 year ago Annual physical exam   [facility] Olin Hauser, DO

## 2019-12-07 IMAGING — DX DG FOOT COMPLETE 3+V*R*
3 series · 3 of 3 positions shown · non-contrast
Comparison: None.

CLINICAL DATA: Right foot pain

EXAM:
RIGHT FOOT COMPLETE - 3+ VIEW

[foot ap]
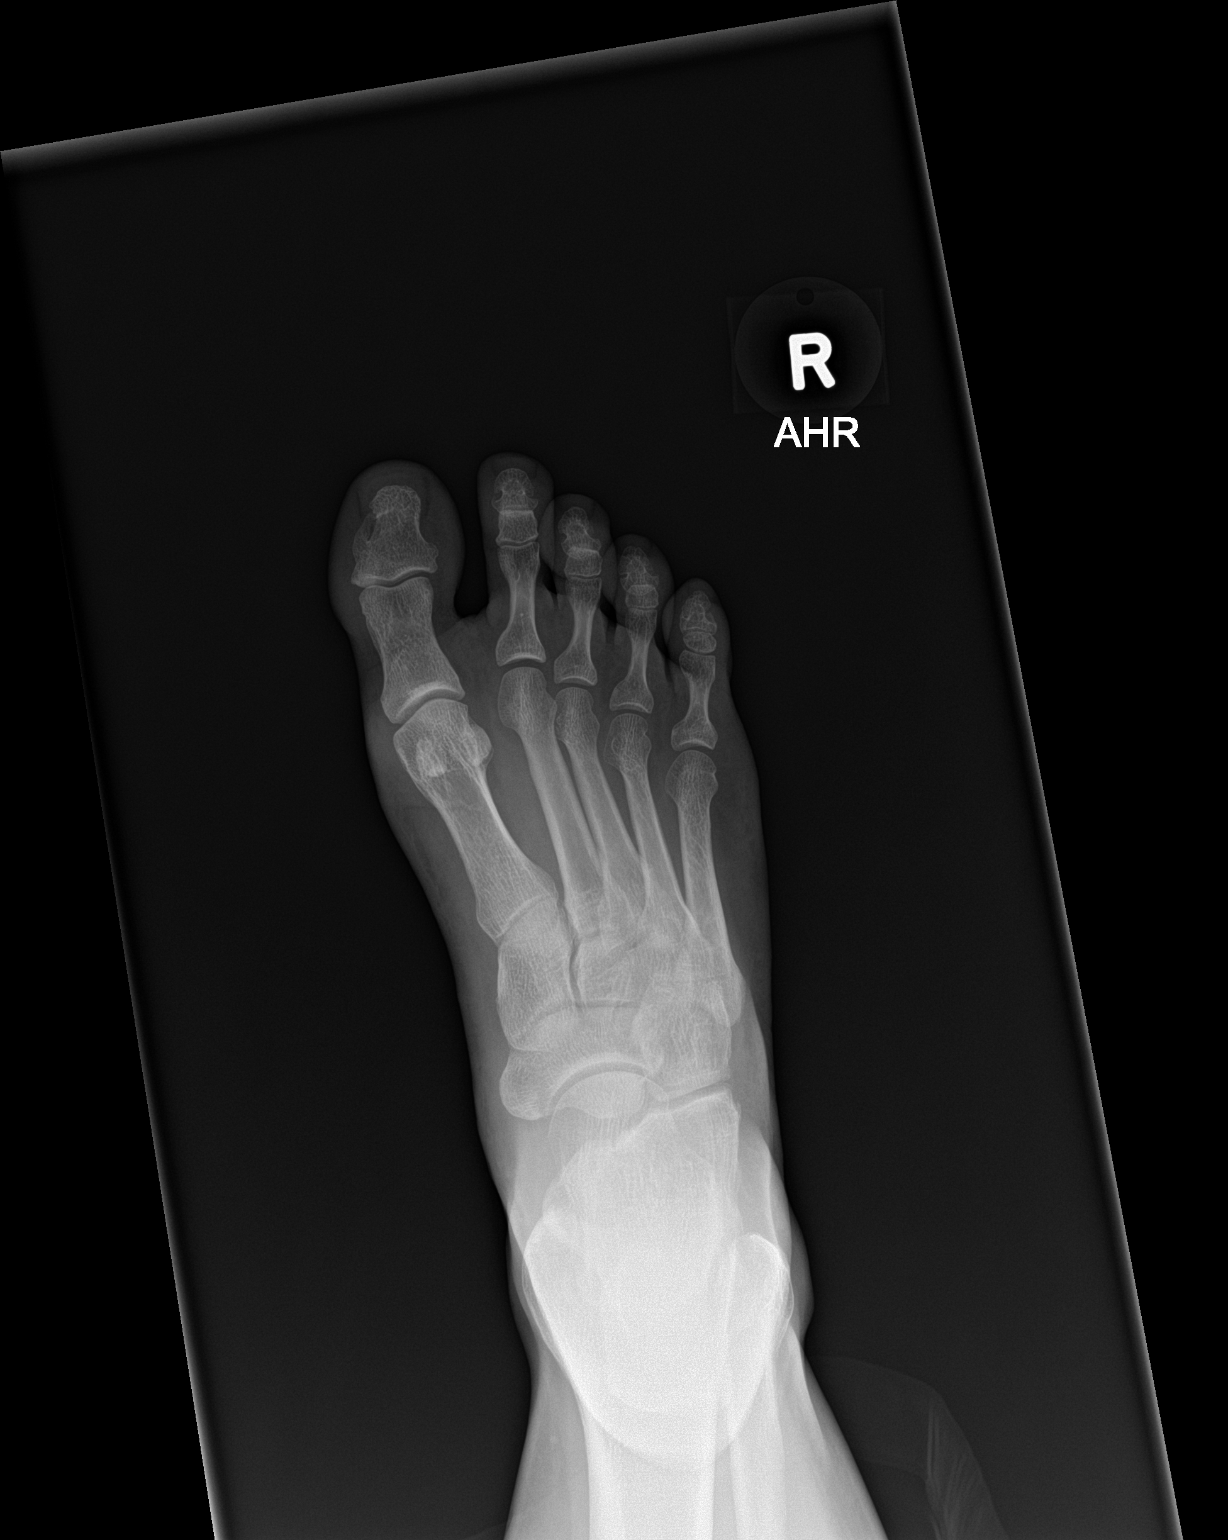

[foot obl]
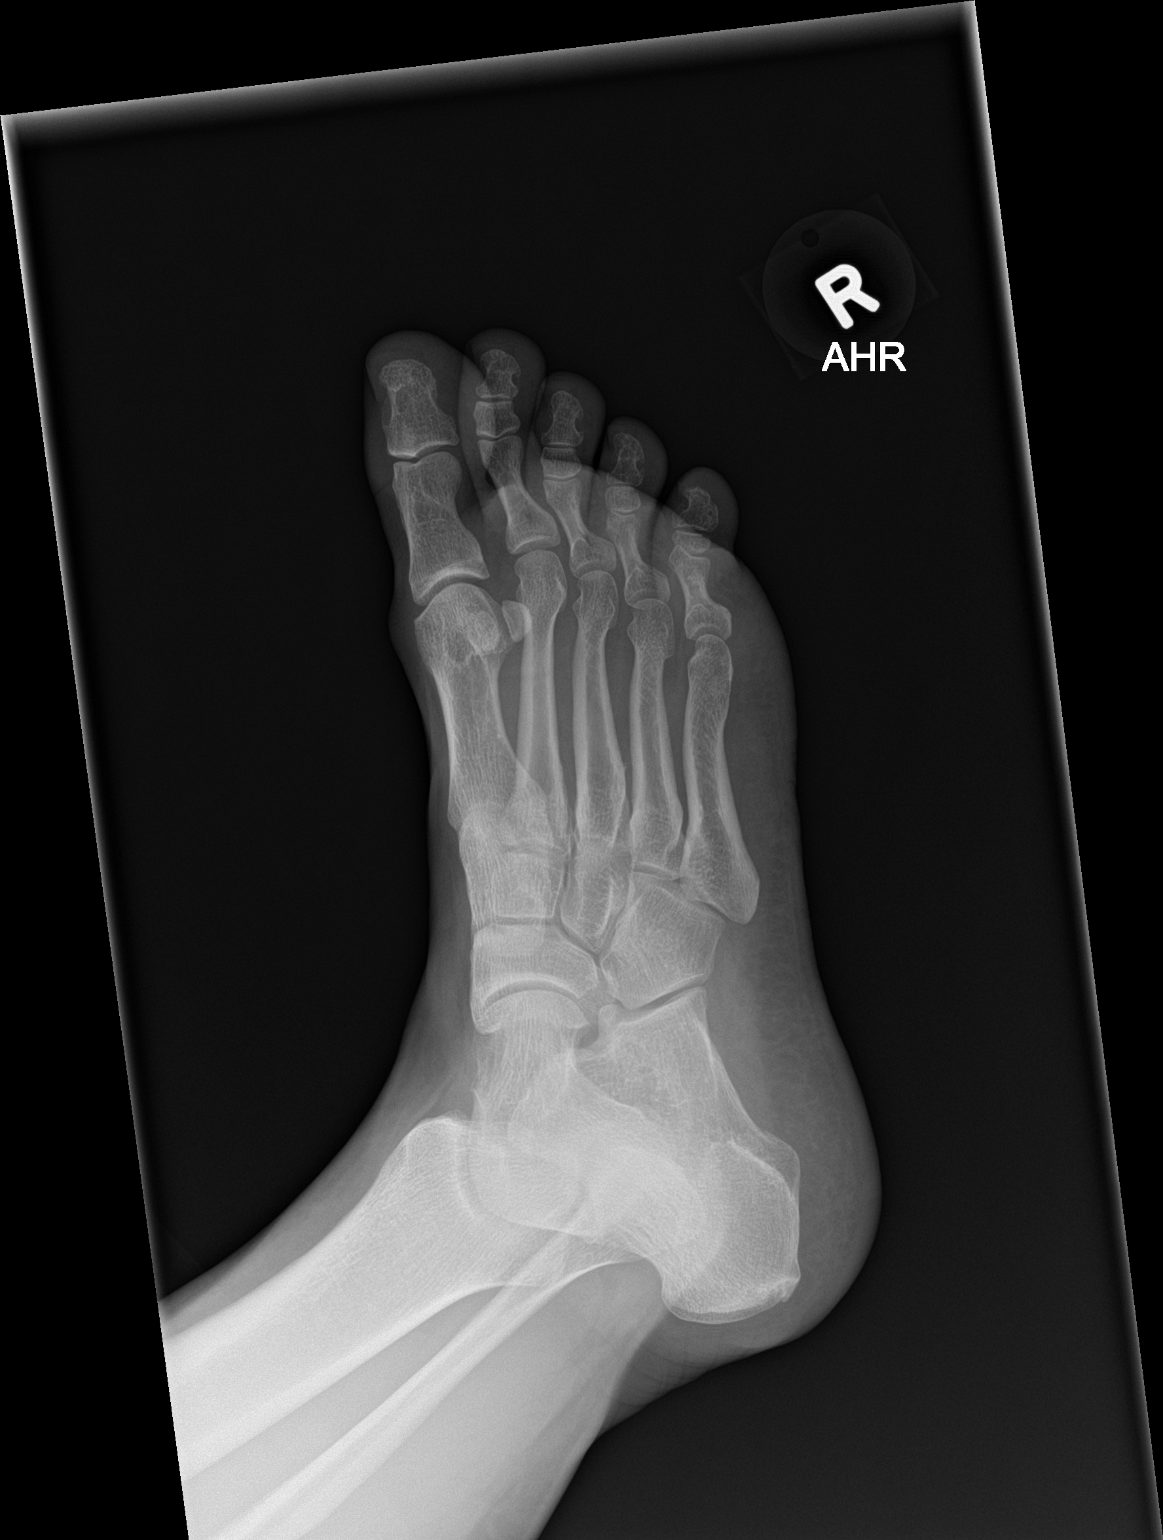

[foot lat]
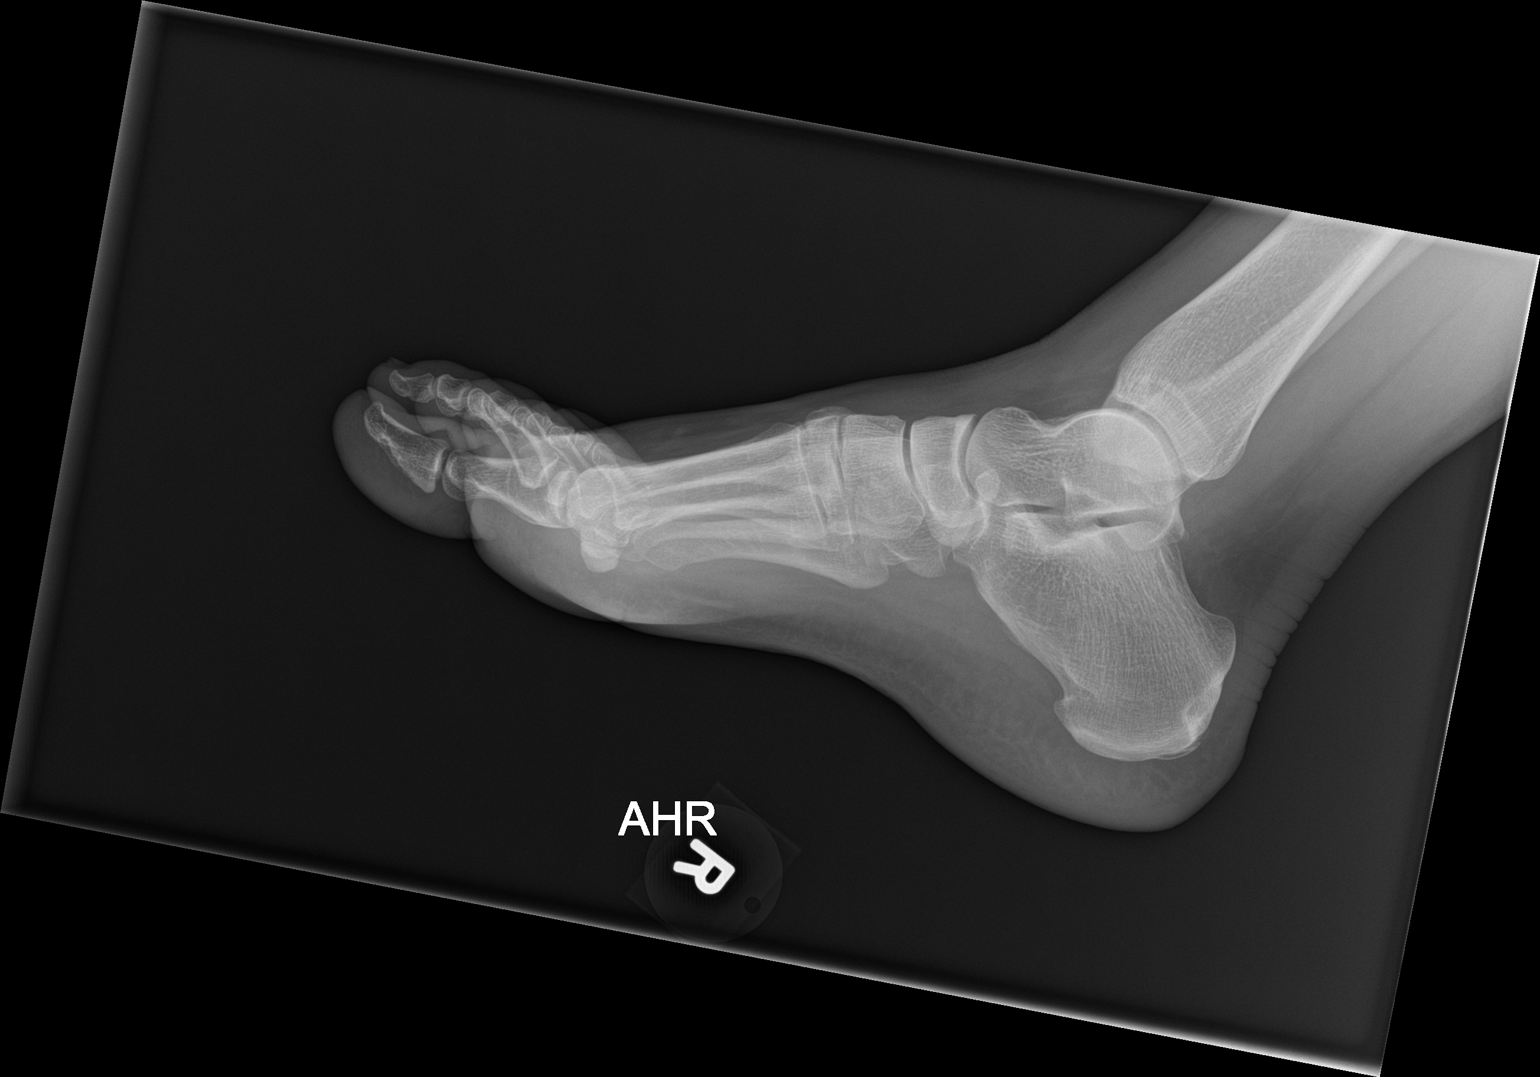

[3 of 3 positions shown; findings below may reference images not displayed]

FINDINGS: There is no evidence of fracture or dislocation. There is no
evidence of arthropathy or other focal bone abnormality. Soft
tissues are unremarkable.
IMPRESSION: Negative.

## 2019-12-14 ENCOUNTER — Other Ambulatory Visit: Payer: Self-pay | Admitting: Family Medicine

## 2019-12-14 DIAGNOSIS — E1169 Type 2 diabetes mellitus with other specified complication: Secondary | ICD-10-CM

## 2019-12-14 NOTE — Telephone Encounter (Signed)
Requested Prescriptions  Pending Prescriptions Disp Refills  . OZEMPIC, 1 MG/DOSE, 4 MG/3ML SOPN [Pharmacy Med Name: OZEMPIC 1MG  PER DOSE (1X4MG  PEN)] 9 mL 0    Sig: INJECT 1 MG INTO THE SKIN EVERY WEEK     Endocrinology:  Diabetes - GLP-1 Receptor Agonists Passed - 12/14/2019 12:00 PM      Passed - HBA1C is between 0 and 7.9 and within 180 days    Hgb A1c MFr Bld  Date Value Ref Range Status  07/05/2019 6.7 (H) <5.7 % of total Hgb Final    Comment:    For someone without known diabetes, a hemoglobin A1c value of 6.5% or greater indicates that they may have  diabetes and this should be confirmed with a follow-up  test. . For someone with known diabetes, a value <7% indicates  that their diabetes is well controlled and a value  greater than or equal to 7% indicates suboptimal  control. A1c targets should be individualized based on  duration of diabetes, age, comorbid conditions, and  other considerations. . Currently, no consensus exists regarding use of hemoglobin A1c for diagnosis of diabetes for children. Renella Cunas - Valid encounter within last 6 months    Recent Outpatient Visits          5 months ago Annual physical exam   [place], DO   8 months ago Type 2 diabetes mellitus with other specified complication, without long-term current use of insulin [facility])   [place], DO   8 months ago Type 2 diabetes mellitus with other specified complication, without long-term current use of insulin [facility])   [place], DO   1 year ago Irregular menstrual cycle   [facility] Olin Hauser, DO   1 year ago Annual physical exam   [facility] Olin Hauser, DO

## 2020-01-07 ENCOUNTER — Other Ambulatory Visit: Payer: Self-pay | Admitting: Family Medicine

## 2020-01-07 DIAGNOSIS — F3341 Major depressive disorder, recurrent, in partial remission: Secondary | ICD-10-CM

## 2020-01-22 DIAGNOSIS — Z03818 Encounter for observation for suspected exposure to other biological agents ruled out: Secondary | ICD-10-CM | POA: Diagnosis not present

## 2020-01-22 DIAGNOSIS — Z1152 Encounter for screening for COVID-19: Secondary | ICD-10-CM | POA: Diagnosis not present

## 2020-01-24 ENCOUNTER — Encounter: Payer: Self-pay | Admitting: Family Medicine

## 2020-01-24 ENCOUNTER — Other Ambulatory Visit: Payer: Self-pay

## 2020-01-24 ENCOUNTER — Ambulatory Visit (INDEPENDENT_AMBULATORY_CARE_PROVIDER_SITE_OTHER): Payer: BC Managed Care – PPO | Admitting: Family Medicine

## 2020-01-24 VITALS — BP 132/84 | HR 104 | Temp 97.3°F | Resp 16 | Ht 66.0 in | Wt 238.0 lb

## 2020-01-24 DIAGNOSIS — Z23 Encounter for immunization: Secondary | ICD-10-CM

## 2020-01-24 DIAGNOSIS — E1169 Type 2 diabetes mellitus with other specified complication: Secondary | ICD-10-CM

## 2020-01-24 DIAGNOSIS — F3341 Major depressive disorder, recurrent, in partial remission: Secondary | ICD-10-CM

## 2020-01-24 LAB — POCT GLYCOSYLATED HEMOGLOBIN (HGB A1C): Hemoglobin A1C: 6.3 % — AB (ref 4.0–5.6)

## 2020-01-24 NOTE — Progress Notes (Signed)
Subjective:    Patient ID: Diana Cunningham, female    DOB: 07-Sep-1980, 39 y.o.   MRN: [id number]  Diana Cunningham is a 39 y.o. female presenting on 01/24/2020 for Diabetes   HPI   CHRONIC DM, Type 2/ Morbid Obesity BMI >38 w/ comorbidities Improved overall on GLP1 still A1c due today CBGs: Avg100-140, Low>90 rarely, high >250+ rarely with stress. Checks CBGs 1-2x every day Meds:Ozempic1mg  [place] weekly inj,Metformin 1000mg BID Tolerating med well, rarely nausea side effect, rarely taking zofran ODT Currently on ARB (Losartan half tab dose 12.5mg ) Lifestyle: Weight increased slightly - Diet (goals to improve diet healthy options low carb) - Exercise (Goal to resumeyoga for exercise again, helps her mood as well) - Due ophthalmology exam, [place] May 2021 Denies hypoglycemia, polyuria, visual changes, numbness or tingling  DEPRESSION, Major - In Remission: Today doing well. Says symptoms resolved on med Escitalopram 20mg  daily. Tolerating well without side effects Never established with Psychiatry or Psychology therapy. Previously in 05/2016 she was increased from Escitalopram 10 to 20mg  daily Today doing well, has reduced to Escitalopram 10mg  daily, half of 20mg  now.  Health Maintenance:  Due for Flu Shot, will receive today   UTD [place] March and August 23 2019  Depression screen [facility] 2/9 01/24/2020 07/12/2019 03/28/2019  Decreased Interest 0 0 0  Down, Depressed, Hopeless 0 0 0  PHQ - 2 Score 0 0 0  Altered sleeping 1 0 0  Tired, decreased energy 1 0 0  Change in appetite 1 0 0  Feeling bad or failure about yourself  0 0 0  Trouble concentrating 0 0 0  Moving slowly or fidgety/restless 0 0 0  Suicidal thoughts 0 0 0  PHQ-9 Score 3 0 0  Difficult doing work/chores Not difficult at all Not difficult at all Not difficult at all  Some recent data might be hidden   GAD 7 : Generalized Anxiety Score 01/24/2020 06/22/2018  Nervous, Anxious, on  Edge 0 0  Control/stop worrying 1 0  Worry too much - different things 1 0  Trouble relaxing 0 1  Restless 1 0  Easily annoyed or irritable 0 1  Afraid - awful might happen 0 0  Total GAD 7 Score 3 2  Anxiety Difficulty Not difficult at all Not difficult at all      Social History   Tobacco Use  . Smoking status: Former Smoker    Packs/day: 1.00    Years: 1.00    Pack years: 1.00    Types: Cigarettes    Quit date: 05/10/1999    Years since quitting: 20.7  . Smokeless tobacco: Former Network engineer Use Topics  . Alcohol use: Yes    Alcohol/week: 0.0 standard drinks    Comment: on occasion  . Drug use: No    Review of Systems Per HPI unless specifically indicated above     Objective:    BP 132/84 (BP Location: Left Arm, Cuff Size: Normal)   Pulse (!) 104   Temp (!) 97.3 F (36.3 C) (Temporal)   Resp 16   Ht 5\' 6"  (1.676 m)   Wt 238 lb (108 kg)   SpO2 98%   BMI 38.41 kg/m   Wt Readings from Last 3 Encounters:  01/24/20 238 lb (108 kg)  07/12/19 234 lb (106.1 kg)  04/12/19 236 lb (107 kg)    Physical Exam Vitals and nursing note reviewed.  Constitutional:      General: She is not in acute  distress.    Appearance: She is well-developed. She is obese. She is not diaphoretic.     Comments: Well-appearing, comfortable, cooperative  HENT:     Head: Normocephalic and atraumatic.  Eyes:     General:        Right eye: No discharge.        Left eye: No discharge.     Conjunctiva/sclera: Conjunctivae normal.  Neck:     Thyroid: No thyromegaly.  Cardiovascular:     Rate and Rhythm: Normal rate and regular rhythm.     Heart sounds: Normal heart sounds. No murmur heard.   Pulmonary:     Effort: Pulmonary effort is normal. No respiratory distress.     Breath sounds: Normal breath sounds. No wheezing or rales.  Musculoskeletal:        General: Normal range of motion.     Cervical back: Normal range of motion and neck supple.  Lymphadenopathy:     Cervical: No  cervical adenopathy.  Skin:    General: Skin is warm and dry.     Findings: No erythema or rash.  Neurological:     Mental Status: She is alert and oriented to person, place, and time.  Psychiatric:        Behavior: Behavior normal.     Comments: Well groomed, good eye contact, normal speech and thoughts      Diabetic Foot Exam - Simple   Simple Foot Form Diabetic Foot exam was performed with the following findings: Yes 01/24/2020  8:31 AM  Visual Inspection See comments: Yes Sensation Testing Intact to touch and monofilament testing bilaterally: Yes Pulse Check Posterior Tibialis and Dorsalis pulse intact bilaterally: Yes Comments Right foot with great toe callus formation medial aspect, no ulceration. Intact to monofilament.    Recent Labs    04/12/19 0824 07/05/19 0759 01/24/20 0818  HGBA1C 6.7* 6.7* 6.3*    Results for orders placed or performed in visit on 01/24/20  POCT HgB A1C  Result Value Ref Range   Hemoglobin A1C 6.3 (A) 4.0 - 5.6 %      Assessment & Plan:   Problem List Items Addressed This Visit    Type 2 diabetes mellitus with other specified complication ([place]) - Primary    Improved M6Q to 6.3 Complications - other including hyperlipidemia, depression, morbid obesity - increases risk of future cardiovascular complications and poor glucose control due to reduced lifestyle diet/exercise with low energy mood and fatigue  Plan:  1. Continue Ozempic 1mg  weekly inj - Continue Metformin from 500 BID - future reduce or stop 2. Encourage improved lifestyle - may continue low carb, low sugar diet, reduce portion size, continue improving regular exercise 3. Check CBG, bring log to next visit for review 4. Continue ARB      Relevant Orders   POCT HgB A1C (Completed)   Recurrent major depression in partial remission (HCC)    Stable, controlled mood on SSRI No current psychiatry  Plan Continue current Escitalopram 10mg  daily, half tab      Relevant  Medications   escitalopram (LEXAPRO) 20 MG tablet    Other Visit Diagnoses    Needs flu shot       Relevant Orders   Flu Vaccine QUAD 36+ mos IM (Completed)      No orders of the defined types were placed in this encounter.     Follow up plan: Return in about 6 months (around 07/23/2020) for Follow-up 6 month fasting lab only then 1 week  later Annual Physical.  Future labs ordered for 07/17/20  Add Uric acid, Hep C add, TSH   Nobie Putnam, DO [facility] 01/24/2020, 8:14 AM

## 2020-01-24 NOTE — Patient Instructions (Addendum)
Thank you for coming to the office today.  Recent Labs    04/12/19 0824 07/05/19 0759 01/24/20 0818  HGBA1C 6.7* 6.7* 6.3*    Likely stress causing spike in sugars, keep up good work.  Flu shot today  Likely 8 months after April for booster vaccine  Reduce escitalopram from 20 to 10, cut in half, call or message if you want me to re order a 10mg  tablet.   DUE for FASTING BLOOD WORK (no food or drink after midnight before the lab appointment, only water or coffee without cream/sugar on the morning of)  SCHEDULE "Lab Only" visit in the morning at the clinic for lab draw in 6 MONTHS   - Make sure Lab Only appointment is at about 1 week before your next appointment, so that results will be available  For Lab Results, once available within 2-3 days of blood draw, you can can log in to MyChart online to view your results and a brief explanation. Also, we can discuss results at next follow-up visit.   Please schedule a Follow-up Appointment to: Return in about 6 months (around 07/23/2020) for Follow-up 6 month fasting lab only then 1 week later Annual Physical.  If you have any other questions or concerns, please feel free to call the office or send a message through [place]. You may also schedule an earlier appointment if necessary.  Additionally, you may be receiving a survey about your experience at our office within a few days to 1 week by e-mail or mail. We value your feedback.  Nobie Putnam, DO [place]

## 2020-01-25 ENCOUNTER — Other Ambulatory Visit: Payer: Self-pay | Admitting: Family Medicine

## 2020-01-25 DIAGNOSIS — M1A0690 Idiopathic chronic gout, unspecified knee, without tophus (tophi): Secondary | ICD-10-CM

## 2020-01-25 DIAGNOSIS — E1169 Type 2 diabetes mellitus with other specified complication: Secondary | ICD-10-CM

## 2020-01-25 DIAGNOSIS — Z0000 Encounter for general adult medical examination without abnormal findings: Secondary | ICD-10-CM

## 2020-01-25 DIAGNOSIS — F3342 Major depressive disorder, recurrent, in full remission: Secondary | ICD-10-CM | POA: Insufficient documentation

## 2020-01-25 DIAGNOSIS — F3341 Major depressive disorder, recurrent, in partial remission: Secondary | ICD-10-CM

## 2020-01-25 DIAGNOSIS — Z1159 Encounter for screening for other viral diseases: Secondary | ICD-10-CM

## 2020-01-25 NOTE — Assessment & Plan Note (Signed)
Stable, controlled mood on SSRI No current psychiatry  Plan Continue current Escitalopram 10mg  daily, half tab

## 2020-01-25 NOTE — Assessment & Plan Note (Signed)
Improved J7P to 6.3 Complications - other including hyperlipidemia, depression, morbid obesity - increases risk of future cardiovascular complications and poor glucose control due to reduced lifestyle diet/exercise with low energy mood and fatigue  Plan:  1. Continue Ozempic 1mg  weekly inj - Continue Metformin from 500 BID - future reduce or stop 2. Encourage improved lifestyle - may continue low carb, low sugar diet, reduce portion size, continue improving regular exercise 3. Check CBG, bring log to next visit for review 4. Continue ARB

## 2020-01-30 ENCOUNTER — Other Ambulatory Visit: Payer: Self-pay | Admitting: Family Medicine

## 2020-01-30 DIAGNOSIS — E119 Type 2 diabetes mellitus without complications: Secondary | ICD-10-CM

## 2020-01-30 DIAGNOSIS — E1169 Type 2 diabetes mellitus with other specified complication: Secondary | ICD-10-CM

## 2020-01-30 NOTE — Telephone Encounter (Signed)
Requested  medications are  due for refill today yes  Requested medications are on the active medication list yes  Last refill 6/27  Last visit 01/2020  Future visit scheduled yes 07/2020  Notes to clinic Last  visit note states continue Metformin 500 twice a day, rx is for 500mg  take two (1000mg ) twice a day, HgA1c 6.3.

## 2020-03-07 ENCOUNTER — Other Ambulatory Visit: Payer: Self-pay | Admitting: Family Medicine

## 2020-03-07 DIAGNOSIS — R110 Nausea: Secondary | ICD-10-CM

## 2020-03-07 NOTE — Telephone Encounter (Signed)
Requested medications are due for refill today yes  Requested medications are on the active medication list yes  Last refill 6/27  Last visit 04/2019  Future visit scheduled 07/2020  Notes to clinic Failed protocol of valid visit within 6 months.

## 2020-03-10 ENCOUNTER — Other Ambulatory Visit: Payer: Self-pay | Admitting: Family Medicine

## 2020-03-10 DIAGNOSIS — R110 Nausea: Secondary | ICD-10-CM

## 2020-03-10 NOTE — Addendum Note (Signed)
Addended by: Elliot Cousin on: 03/10/2020 12:13 PM   Modules accepted: Orders

## 2020-03-10 NOTE — Telephone Encounter (Signed)
PT need a refill  ondansetron (ZOFRAN-ODT) 4 MG disintegrating tablet [[id number]] [id number] - [place], [place] - [address] [place] [address] Phone: [phone] Fax: [phone]

## 2020-03-10 NOTE — Telephone Encounter (Signed)
Requested medication (s) are due for refill today: yes  Requested medication (s) are on the active medication list: yes  Last refill:  08/22/19 #30 with 1 refill  Future visit scheduled: yes  Notes to clinic:  Please review for refill. Refill not delegated per protocol    Requested Prescriptions  Pending Prescriptions Disp Refills   ondansetron (ZOFRAN-ODT) 4 MG disintegrating tablet 30 tablet 1      Not Delegated - Gastroenterology: Antiemetics Failed - 03/10/2020 12:13 PM      Failed - This refill cannot be delegated      Failed - Valid encounter within last 6 months    Recent Outpatient Visits           1 month ago Type 2 diabetes mellitus with other specified complication, without long-term current use of insulin [facility])   [facility], Devonne Doughty, DO   8 months ago Annual physical exam   [facility] Olin Hauser, DO   11 months ago Type 2 diabetes mellitus with other specified complication, without long-term current use of insulin [facility])   [facility], Devonne Doughty, DO   11 months ago Type 2 diabetes mellitus with other specified complication, without long-term current use of insulin [facility])   [place], DO   1 year ago Irregular menstrual cycle   [place], DO       Future Appointments             In 4 months Parks Ranger, Devonne Doughty, DO [facility], [facility]

## 2020-03-15 ENCOUNTER — Other Ambulatory Visit: Payer: Self-pay | Admitting: Family Medicine

## 2020-03-15 DIAGNOSIS — E1169 Type 2 diabetes mellitus with other specified complication: Secondary | ICD-10-CM

## 2020-03-15 NOTE — Telephone Encounter (Signed)
Requested Prescriptions  Pending Prescriptions Disp Refills  . OZEMPIC, 1 MG/DOSE, 4 MG/3ML SOPN [Pharmacy Med Name: OZEMPIC 1MG  PER DOSE (1X4MG  PEN)] 9 mL 0    Sig: INJECT 1 MG INTO THE SKIN EVERY WEEK     Endocrinology:  Diabetes - GLP-1 Receptor Agonists Failed - 03/15/2020  8:00 AM      Failed - Valid encounter within last 6 months    Recent Outpatient Visits          1 month ago Type 2 diabetes mellitus with other specified complication, without long-term current use of insulin ([place])   [facility], Devonne Doughty, DO   8 months ago Annual physical exam   [facility] Olin Hauser, DO   11 months ago Type 2 diabetes mellitus with other specified complication, without long-term current use of insulin ([place])   [facility], Devonne Doughty, DO   11 months ago Type 2 diabetes mellitus with other specified complication, without long-term current use of insulin ([place])   [place], Devonne Doughty, DO   1 year ago Irregular menstrual cycle   [place], DO      Future Appointments            In 4 months Parks Ranger, Devonne Doughty, DO [facility], [place] is between 0 and 7.9 and within 180 days    Hemoglobin A1C  Date Value Ref Range Status  01/24/2020 6.3 (A) 4.0 - 5.6 % Final   Hgb A1c MFr Bld  Date Value Ref Range Status  07/05/2019 6.7 (H) <5.7 % of total Hgb Final    Comment:    For someone without known diabetes, a hemoglobin A1c value of 6.5% or greater indicates that they may have  diabetes and this should be confirmed with a follow-up  test. . For someone with known diabetes, a value <7% indicates  that their diabetes is well controlled and a value  greater than or equal to 7% indicates suboptimal  control. A1c targets should be individualized based on  duration of diabetes, age, comorbid  conditions, and  other considerations. . Currently, no consensus exists regarding use of hemoglobin A1c for diagnosis of diabetes for children. Marland Kitchen

## 2020-03-16 DIAGNOSIS — R110 Nausea: Secondary | ICD-10-CM

## 2020-03-16 MED ORDER — ONDANSETRON 4 MG PO TBDP: ORAL_TABLET | ORAL | 2 refills | Status: DC

## 2020-04-28 ENCOUNTER — Other Ambulatory Visit: Payer: Self-pay | Admitting: Family Medicine

## 2020-04-28 DIAGNOSIS — E1169 Type 2 diabetes mellitus with other specified complication: Secondary | ICD-10-CM

## 2020-06-16 ENCOUNTER — Ambulatory Visit: Payer: Self-pay | Admitting: Family Medicine

## 2020-06-16 NOTE — Telephone Encounter (Signed)
Pt reports covid positive 05/11/20. States 1-1/2 weeks post covid experienced vertigo. States spinning sensation with turning head, turning in bed, sitting to standing, fast movements. Pt saw PCP last year for issue. States has been performing Epleys, ineffective. Placed on [place] last time occurred "Did help then." Denies any associated symptoms. Occurs throughout day. Agent secured appt for Monday 06/22/20 prior to triage. Disposition calls for appt 2-3 days. Assured pt NT would route to practice for review. Advised ED if symptoms worsen. Care advise per protocol. Pt verbalizes understanding.   Please advise: [phone]  Reason for Disposition . [1] MODERATE dizziness (e.g., vertigo; feels very unsteady, interferes with normal activities) AND [2] has been evaluated by physician for this  Answer Assessment - Initial Assessment Questions 1. DESCRIPTION: "Describe your dizziness."     Spinning 2. VERTIGO: "Do you feel like either you or the room is spinning or tilting?"      yes 3. LIGHTHEADED: "Do you feel lightheaded?" (e.g., somewhat faint, woozy, weak upon standing)    yes 4. SEVERITY: "How bad is it?"  "Can you walk?"   - MILD: Feels unsteady but walking normally.   - MODERATE: Feels very unsteady when walking, but not falling; interferes with normal activities (e.g., school, work) .   - SEVERE: Unable to walk without falling, or requires assistance to walk without falling.     moderate 5. ONSET:  "When did the dizziness begin?"     Mid Jan. After covid 6. AGGRAVATING FACTORS: "Does anything make it worse?" (e.g., standing, change in head position)     Turning head, sitting to standing, turning in bed 7. CAUSE: "What do you think is causing the dizziness?"     Unsure 8. RECURRENT SYMPTOM: "Have you had dizziness before?" If Yes, ask: "When was the last time?" "What happened that time?"     No Presented after covid positive. Did not have during covid 9. OTHER SYMPTOMS: "Do you have any  other symptoms?" (e.g., headache, weakness, numbness, vomiting, earache)    no  Protocols used: DIZZINESS - VERTIGO-A-AH

## 2020-06-22 ENCOUNTER — Other Ambulatory Visit: Payer: Self-pay

## 2020-06-22 ENCOUNTER — Ambulatory Visit (INDEPENDENT_AMBULATORY_CARE_PROVIDER_SITE_OTHER): Payer: BC Managed Care – PPO | Admitting: Family Medicine

## 2020-06-22 ENCOUNTER — Other Ambulatory Visit: Payer: Self-pay | Admitting: Family Medicine

## 2020-06-22 ENCOUNTER — Encounter: Payer: Self-pay | Admitting: Family Medicine

## 2020-06-22 DIAGNOSIS — E1169 Type 2 diabetes mellitus with other specified complication: Secondary | ICD-10-CM

## 2020-06-22 DIAGNOSIS — H8111 Benign paroxysmal vertigo, right ear: Secondary | ICD-10-CM

## 2020-06-22 MED ORDER — OZEMPIC (1 MG/DOSE) 4 MG/3ML ~~LOC~~ SOPN: 1.0000 mg | PEN_INJECTOR | SUBCUTANEOUS | 3 refills | Status: DC

## 2020-06-22 MED ORDER — MECLIZINE HCL 25 MG PO TABS: 25.0000 mg | ORAL_TABLET | Freq: Three times a day (TID) | ORAL | 1 refills | Status: DC | PRN

## 2020-06-22 NOTE — Progress Notes (Signed)
Subjective:    Patient ID: Diana Cunningham, female    DOB: 1980-07-16, 40 y.o.   MRN: [id number]  Diana Cunningham is a 40 y.o. female presenting on 06/22/2020 for Dizziness   HPI   Post-COVID Dizziness / Vertigo Reports symptoms onset after January 2022, she can have episodic dizziness spells, even while sitting. Often she can get symptoms that are predictable in the morning when first getting up, causing her to lose balance. - She has history of vertigo since 2018, she was treated at that time Epley maneuver and Meclizine with improvement. - Often even if just laying down in bed it can cause or provoke - Upcoming plane flight, she wanted to be evaluated. Denies any numbness tingling weakness headache syncopal episode  Health Maintenance: Recommend Booster dose Pfizer when ready.  Depression screen [facility] 2/9 01/24/2020 07/12/2019 03/28/2019  Decreased Interest 0 0 0  Down, Depressed, Hopeless 0 0 0  PHQ - 2 Score 0 0 0  Altered sleeping 1 0 0  Tired, decreased energy 1 0 0  Change in appetite 1 0 0  Feeling bad or failure about yourself  0 0 0  Trouble concentrating 0 0 0  Moving slowly or fidgety/restless 0 0 0  Suicidal thoughts 0 0 0  PHQ-9 Score 3 0 0  Difficult doing work/chores Not difficult at all Not difficult at all Not difficult at all  Some recent data might be hidden    Social History   Tobacco Use  . Smoking status: Former Smoker    Packs/day: 1.00    Years: 1.00    Pack years: 1.00    Types: Cigarettes    Quit date: 05/10/1999    Years since quitting: 21.1  . Smokeless tobacco: Former Network engineer Use Topics  . Alcohol use: Yes    Alcohol/week: 0.0 standard drinks    Comment: on occasion  . Drug use: No    Review of Systems Per HPI unless specifically indicated above     Objective:    BP 139/87   Pulse 96   Ht 5\' 5"  (1.651 m)   Wt 239 lb 12.8 oz (108.8 kg)   SpO2 98%   BMI 39.90 kg/m   Wt Readings from Last 3 Encounters:  06/22/20 239 lb 12.8  oz (108.8 kg)  01/24/20 238 lb (108 kg)  07/12/19 234 lb (106.1 kg)    Physical Exam Vitals and nursing note reviewed.  Constitutional:      General: She is not in acute distress.    Appearance: She is well-developed and well-nourished. She is not diaphoretic.     Comments: Well-appearing, comfortable, cooperative  HENT:     Head: Normocephalic and atraumatic.     Mouth/Throat:     Mouth: Oropharynx is clear and moist.  Eyes:     General:        Right eye: No discharge.        Left eye: No discharge.     Conjunctiva/sclera: Conjunctivae normal.  Cardiovascular:     Rate and Rhythm: Normal rate.  Pulmonary:     Effort: Pulmonary effort is normal.  Musculoskeletal:        General: No edema.  Skin:    General: Skin is warm and dry.     Findings: No erythema or rash.  Neurological:     Mental Status: She is alert and oriented to person, place, and time.  Psychiatric:        Mood and Affect: Mood  and affect normal.        Behavior: Behavior normal.     Comments: Well groomed, good eye contact, normal speech and thoughts       Results for orders placed or performed in visit on 01/24/20  POCT HgB A1C  Result Value Ref Range   Hemoglobin A1C 6.3 (A) 4.0 - 5.6 %      Assessment & Plan:   Problem List Items Addressed This Visit   None   Visit Diagnoses    Benign paroxysmal positional vertigo of right ear       Relevant Medications   meclizine (ANTIVERT) 25 MG tablet      Suspected flare of acute on chronic BPPV has history of vertigo in past R sided Now post covid complication, brief episodic classic vertigo symptoms, can be provoked or concerning that often it is unprovoked - No other significant neurological findings or focal deficits  Plan: 1. Handout given with Epley maneuver TID for 1-2 weeks until resolved 2. Rx meclizine PRN for breakthrough symptoms 3. Return criteria, if not improved consider vestibular PT referral vs ENT   Meds ordered this encounter   Medications  . meclizine (ANTIVERT) 25 MG tablet    Sig: Take 1 tablet (25 mg total) by mouth 3 (three) times daily as needed for dizziness.    Dispense:  60 tablet    Refill:  1     Follow up plan: Return if symptoms worsen or fail to improve.  Nobie Putnam, [facility] 06/22/2020, 10:06 AM

## 2020-06-22 NOTE — Patient Instructions (Addendum)
Thank you for coming to the office today.  Trial on Meclizine.  If not improving we can refer to ENT when you return.  - To treat this, try the Epley Manuever (see diagrams/instructions below) at home up to 3 times a day for 1-2 weeks or until symptoms resolve - You may take Meclizine as needed up to 3 times a day for dizziness, this will not cure symptoms but may help. Caution may make you drowsy.  If you develop significant worsening episode with vertigo that does not improve and you get severe headache, loss of vision, arm or leg weakness, slurred speech, or other concerning symptoms please seek immediate medical attention at Emergency Department.  Please schedule a follow-up appointment with Dr Parks Ranger within 4 weeks if Vertigo not improving, and will consider Referral to Vestibular Rehab   You are eligible for [place] Booster when ready.  COVID Vaccine Information  You can get booster dose at [facility], [facility] - check their websites or call to schedule in advance. Appointment is required.  [facility] Vaccine Information  [email or web address]  Appointments are required. To register for your free vaccination appointment, click the link on the date on the calendar located on the website or call [phone].  NOTICE ON BOOSTERS Chief Operating Officer Now Available to Eligible Populations [place] is now offering at all [employer] vaccination clinics a Pfizer booster dose to the following eligible populations, six months after receiving the second dose of the Pfizer vaccine:  People ages 31 and older and residents in long-term care settings should receive it. People ages 93 to 40 with underlying medical conditions should receive it. People ages 57 to 46 with underlying medical conditions may receive it, based on individual benefits and risks People ages 54 to 51 who are at increased risk of occupational exposure and  transmission, such as health care workers, may receive it, based on individual benefits and risks Appointments are required. To register, click the link on the date in the calendar above or call [phone], Monday-Friday, 7 a.m.-7 p.m.   Please schedule a Follow-up Appointment to: Return if symptoms worsen or fail to improve.  If you have any other questions or concerns, please feel free to call the office or send a message through [place]. You may also schedule an earlier appointment if necessary.  Additionally, you may be receiving a survey about your experience at our office within a few days to 1 week by e-mail or mail. We value your feedback.  Nobie Putnam, DO [place]   See the next page for images describing the Epley Manuever.     ----------------------------------------------------------------------------------------------------------------------

## 2020-07-16 ENCOUNTER — Other Ambulatory Visit: Payer: Self-pay | Admitting: *Deleted

## 2020-07-16 DIAGNOSIS — Z1159 Encounter for screening for other viral diseases: Secondary | ICD-10-CM

## 2020-07-16 DIAGNOSIS — E785 Hyperlipidemia, unspecified: Secondary | ICD-10-CM

## 2020-07-16 DIAGNOSIS — Z0000 Encounter for general adult medical examination without abnormal findings: Secondary | ICD-10-CM

## 2020-07-16 DIAGNOSIS — M1A0690 Idiopathic chronic gout, unspecified knee, without tophus (tophi): Secondary | ICD-10-CM

## 2020-07-16 DIAGNOSIS — F3341 Major depressive disorder, recurrent, in partial remission: Secondary | ICD-10-CM

## 2020-07-16 DIAGNOSIS — E1169 Type 2 diabetes mellitus with other specified complication: Secondary | ICD-10-CM

## 2020-07-17 ENCOUNTER — Other Ambulatory Visit: Payer: Self-pay

## 2020-07-17 ENCOUNTER — Other Ambulatory Visit: Payer: BC Managed Care – PPO

## 2020-07-17 DIAGNOSIS — E785 Hyperlipidemia, unspecified: Secondary | ICD-10-CM | POA: Diagnosis not present

## 2020-07-17 DIAGNOSIS — M1A0690 Idiopathic chronic gout, unspecified knee, without tophus (tophi): Secondary | ICD-10-CM | POA: Diagnosis not present

## 2020-07-17 DIAGNOSIS — E1169 Type 2 diabetes mellitus with other specified complication: Secondary | ICD-10-CM | POA: Diagnosis not present

## 2020-07-17 DIAGNOSIS — Z1159 Encounter for screening for other viral diseases: Secondary | ICD-10-CM | POA: Diagnosis not present

## 2020-07-20 LAB — COMPLETE METABOLIC PANEL WITH GFR
AG Ratio: 1.4 (calc) (ref 1.0–2.5)
ALT: 29 U/L (ref 6–29)
AST: 18 U/L (ref 10–30)
Albumin: 4.4 g/dL (ref 3.6–5.1)
Alkaline phosphatase (APISO): 87 U/L (ref 31–125)
BUN: 16 mg/dL (ref 7–25)
CO2: 26 mmol/L (ref 20–32)
Calcium: 9.4 mg/dL (ref 8.6–10.2)
Chloride: 102 mmol/L (ref 98–110)
Creat: 0.53 mg/dL (ref 0.50–1.10)
GFR, Est African American: 138 mL/min/{1.73_m2} (ref >=60)
GFR, Est Non African American: 119 mL/min/{1.73_m2} (ref >=60)
Globulin: 3.2 g/dL (calc) (ref 1.9–3.7)
Glucose, Bld: 137 mg/dL — ABNORMAL HIGH (ref 65–99)
Potassium: 4 mmol/L (ref 3.5–5.3)
Sodium: 138 mmol/L (ref 135–146)
Total Bilirubin: 0.5 mg/dL (ref 0.2–1.2)
Total Protein: 7.6 g/dL (ref 6.1–8.1)

## 2020-07-20 LAB — CBC WITH DIFFERENTIAL/PLATELET
Absolute Monocytes: 539 cells/uL (ref 200–950)
Basophils Absolute: 62 cells/uL (ref 0–200)
Basophils Relative: 0.8 %
Eosinophils Absolute: 470 cells/uL (ref 15–500)
Eosinophils Relative: 6.1 %
HCT: 39.7 % (ref 35.0–45.0)
Hemoglobin: 13.2 g/dL (ref 11.7–15.5)
Lymphs Abs: 2302 cells/uL (ref 850–3900)
MCH: 29.3 pg (ref 27.0–33.0)
MCHC: 33.2 g/dL (ref 32.0–36.0)
MCV: 88.2 fL (ref 80.0–100.0)
MPV: 11.2 fL (ref 7.5–12.5)
Monocytes Relative: 7 %
Neutro Abs: 4327 cells/uL (ref 1500–7800)
Neutrophils Relative %: 56.2 %
Platelets: 278 10*3/uL (ref 140–400)
RBC: 4.5 10*6/uL (ref 3.80–5.10)
RDW: 13.6 % (ref 11.0–15.0)
Total Lymphocyte: 29.9 %
WBC: 7.7 10*3/uL (ref 3.8–10.8)

## 2020-07-20 LAB — LIPID PANEL
Cholesterol: 183 mg/dL (ref <200)
HDL: 45 mg/dL — ABNORMAL LOW (ref >=50)
LDL Cholesterol (Calc): 106 mg/dL (calc) — ABNORMAL HIGH
Non-HDL Cholesterol (Calc): 138 mg/dL (calc) — ABNORMAL HIGH (ref <130)
Total CHOL/HDL Ratio: 4.1 (calc) (ref <5.0)
Triglycerides: 198 mg/dL — ABNORMAL HIGH (ref <150)

## 2020-07-20 LAB — HEPATITIS C ANTIBODY
Hepatitis C Ab: NONREACTIVE
SIGNAL TO CUT-OFF: 0.03 (ref <1.00)

## 2020-07-20 LAB — HEMOGLOBIN A1C
Hgb A1c MFr Bld: 6.5 % of total Hgb — ABNORMAL HIGH (ref <5.7)
Mean Plasma Glucose: 140 mg/dL
eAG (mmol/L): 7.7 mmol/L

## 2020-07-20 LAB — URIC ACID: Uric Acid, Serum: 5.7 mg/dL (ref 2.5–7.0)

## 2020-07-20 LAB — TSH: TSH: 2.27 mIU/L

## 2020-07-24 ENCOUNTER — Encounter: Payer: Self-pay | Admitting: Family Medicine

## 2020-07-31 ENCOUNTER — Other Ambulatory Visit: Payer: Self-pay

## 2020-07-31 ENCOUNTER — Ambulatory Visit (INDEPENDENT_AMBULATORY_CARE_PROVIDER_SITE_OTHER): Payer: BC Managed Care – PPO | Admitting: Family Medicine

## 2020-07-31 ENCOUNTER — Encounter: Payer: Self-pay | Admitting: Family Medicine

## 2020-07-31 VITALS — BP 124/80 | HR 109 | Ht 65.0 in | Wt 236.4 lb

## 2020-07-31 DIAGNOSIS — E785 Hyperlipidemia, unspecified: Secondary | ICD-10-CM

## 2020-07-31 DIAGNOSIS — Z0000 Encounter for general adult medical examination without abnormal findings: Secondary | ICD-10-CM

## 2020-07-31 DIAGNOSIS — F3342 Major depressive disorder, recurrent, in full remission: Secondary | ICD-10-CM

## 2020-07-31 DIAGNOSIS — E1169 Type 2 diabetes mellitus with other specified complication: Secondary | ICD-10-CM | POA: Diagnosis not present

## 2020-07-31 DIAGNOSIS — M1A0690 Idiopathic chronic gout, unspecified knee, without tophus (tophi): Secondary | ICD-10-CM

## 2020-07-31 DIAGNOSIS — L84 Corns and callosities: Secondary | ICD-10-CM

## 2020-07-31 MED ORDER — LOSARTAN POTASSIUM 25 MG PO TABS: 12.5000 mg | ORAL_TABLET | Freq: Every day | ORAL | 3 refills | Status: DC

## 2020-07-31 MED ORDER — METFORMIN HCL 500 MG PO TABS: 1000.0000 mg | ORAL_TABLET | Freq: Every day | ORAL | 3 refills | Status: DC

## 2020-07-31 MED ORDER — ESCITALOPRAM OXALATE 10 MG PO TABS: 10.0000 mg | ORAL_TABLET | Freq: Every day | ORAL | 3 refills | Status: DC

## 2020-07-31 NOTE — Patient Instructions (Addendum)
Thank you for coming to the office today.  Recent Labs    01/24/20 0818 07/17/20 0827  HGBA1C 6.3* 6.5*    Improved cholesterol no medication needed now.  Refilled Escitalopram at 10mg  daily  Other meds filled as well.  Ask who does your pedicure about foot creams options etc  If calluses not improving can refer to [facility]  Please schedule a Follow-up Appointment to: Return in about 6 months (around 01/31/2021) for 6 month follow-up DM A1c, foot calluses.  If you have any other questions or concerns, please feel free to call the office or send a message through [place]. You may also schedule an earlier appointment if necessary.  Additionally, you may be receiving a survey about your experience at our office within a few days to 1 week by e-mail or mail. We value your feedback.  Nobie Putnam, DO [place]

## 2020-07-31 NOTE — Progress Notes (Signed)
Subjective:    Patient ID: Diana Cunningham, female    DOB: Sep 19, 1980, 40 y.o.   MRN: [id number]  Diana Cunningham is a 40 y.o. female presenting on 07/31/2020 for Annual Exam and Diabetes   HPI   Here for Annual Physical and Lab Review.   CHRONIC DM, Type 2/ Morbid Obesity BMI >39 w/ comorbidities Last trend A1c 6 range. Last A1c 6.5 Improved on GLP1 CBGs: Avg100-140. No low readings Meds:Ozempic52m Loyal weekly inj,Metformin 10046mID Tolerating med well, no further nausea side effect Currently on ARB (Losartan half tab dose 12.70m870mLifestyle: Weight down  - Diet (goals to improve diet healthy options low carb, doing more 310 shakes) - Exercise (goal to resume exercise) - Due ophthalmology exam, MyERoeblingy 2022 Foot Callus bilateral. Denies hypoglycemia, polyuria, visual changes, numbness or tingling  HYPERLIPIDEMIA: - Reports no concerns. Last lipid panel 07/2020 - improved overall, still elevated TG 190, and LDL 106 Not on statin or fish oil.  DEPRESSION, Major - In Remission: Today doing well. Says symptoms resolved on med Escitalopram 69m51mily. Tolerating well without side effects Never on other medications. Never established with Psychiatry or Psychology therapy. Previously in 05/2016 she was increased from Escitalopram 10 to 69mg68mly   Gout No flares in several years. Last lab Uric Acid 5.7   Motion sickness on airplane takes Dramamine usually had problem with chewable   Health Maintenance:  Cervical Cancer Screening: UTD Last pap 05/2018. No fam history of cervical or GYN cancer. Next 05/2021  No fam historyof colon cancer.  UTD Pneumonia vaccine - in setting of diabetes, before age 49.  70D Flu vaccine 04/2019  Hep C screening negative.  Due for COVID Booster when ready. Completed 1st 2 doses in PfizeCoca-Colapression screen [facility] 01/24/2020 07/12/2019  Decreased Interest 0 0 0  Down, Depressed, Hopeless  0 0 0  PHQ - 2 Score 0 0 0  Altered sleeping 0 1 0  Tired, decreased energy 0 1 0  Change in appetite 0 1 0  Feeling bad or failure about yourself  0 0 0  Trouble concentrating 0 0 0  Moving slowly or fidgety/restless 0 0 0  Suicidal thoughts 0 0 0  PHQ-9 Score 0 3 0  Difficult doing work/chores Not difficult at all Not difficult at all Not difficult at all  Some recent data might be hidden   GAD 7 : Generalized Anxiety Score 01/24/2020 06/22/2018  Nervous, Anxious, on Edge 0 0  Control/stop worrying 1 0  Worry too much - different things 1 0  Trouble relaxing 0 1  Restless 1 0  Easily annoyed or irritable 0 1  Afraid - awful might happen 0 0  Total GAD 7 Score 3 2  Anxiety Difficulty Not difficult at all Not difficult at all     Past Medical History:  Diagnosis Date  . Pilonidal cyst    Past Surgical History:  Procedure Laterality Date  . PILONIDAL CYST EXCISION  2005   Social History   Socioeconomic History  . Marital status: Single    Spouse name: Not on file  . Number of children: Not on file  . Years of education: Not on file  . Highest education level: Not on file  Occupational History  . Not on file  Tobacco Use  . Smoking status: Former Smoker    Packs/day: 1.00    Years: 1.00    Pack years: 1.00    Types: Cigarettes  Quit date: 05/10/1999    Years since quitting: 21.2  . Smokeless tobacco: Former Network engineer and Sexual Activity  . Alcohol use: Yes    Alcohol/week: 0.0 standard drinks    Comment: on occasion  . Drug use: No  . Sexual activity: Yes  Other Topics Concern  . Not on file  Social History Narrative  . Not on file   Social Determinants of Health   Financial Resource Strain: Not on file  Food Insecurity: Not on file  Transportation Needs: Not on file  Physical Activity: Not on file  Stress: Not on file  Social Connections: Not on file  Intimate Partner Violence: Not on file   Family History  Problem Relation Age of Onset   . Hypertension Mother   . Depression Mother   . Basal cell carcinoma Mother   . Breast cancer Neg Hx   . Colon cancer Neg Hx    Current Outpatient Medications on File Prior to Visit  Medication Sig  . Blood Glucose Monitoring Suppl (ONE TOUCH ULTRA 2) w/Device KIT OneTouch Ultra2 Meter kit  TK UTD BID  . Insulin Pen Needle (NOVOFINE PLUS) 32G X 4 MM MISC Use with Ozempic weekly injection as instructed  . Loratadine (CLARITIN PO) Claritin  . meclizine (ANTIVERT) 25 MG tablet Take 1 tablet (25 mg total) by mouth 3 (three) times daily as needed for dizziness.  . montelukast (SINGULAIR) 10 MG tablet Take 1 tablet (10 mg total) by mouth at bedtime. (Patient taking differently: Take 10 mg by mouth as needed.)  . ondansetron (ZOFRAN-ODT) 4 MG disintegrating tablet DISSOLVE 1 TABLET(4 MG) ON THE TONGUE EVERY 8 HOURS AS NEEDED FOR NAUSEA OR VOMITING  . ONETOUCH DELICA LANCETS 12W MISC U UTD BID  . OZEMPIC, 1 MG/DOSE, 4 MG/3ML SOPN Inject 1 mg as directed once a week.   No current facility-administered medications on file prior to visit.    Review of Systems  Constitutional: Negative for activity change, appetite change, chills, diaphoresis, fatigue and fever.  HENT: Negative for congestion and hearing loss.   Eyes: Negative for visual disturbance.  Respiratory: Negative for cough, chest tightness, shortness of breath and wheezing.   Cardiovascular: Negative for chest pain, palpitations and leg swelling.  Gastrointestinal: Negative for abdominal pain, constipation, diarrhea, nausea and vomiting.  Genitourinary: Negative for dysuria, frequency and hematuria.  Musculoskeletal: Negative for arthralgias and neck pain.  Skin: Negative for rash.  Neurological: Negative for dizziness, weakness, light-headedness, numbness and headaches.  Hematological: Negative for adenopathy.  Psychiatric/Behavioral: Negative for behavioral problems, dysphoric mood and sleep disturbance.   Per HPI unless  specifically indicated above      Objective:    BP 124/80 (BP Location: Left Arm, Cuff Size: Normal)   Pulse (!) 109   Ht '5\' 5"'  (1.651 m)   Wt 236 lb 6.4 oz (107.2 kg)   SpO2 98%   BMI 39.34 kg/m   Wt Readings from Last 3 Encounters:  07/31/20 236 lb 6.4 oz (107.2 kg)  06/22/20 239 lb 12.8 oz (108.8 kg)  01/24/20 238 lb (108 kg)    Physical Exam Vitals and nursing note reviewed.  Constitutional:      General: She is not in acute distress.    Appearance: She is well-developed. She is obese. She is not diaphoretic.     Comments: Well-appearing, comfortable, cooperative  HENT:     Head: Normocephalic and atraumatic.  Eyes:     General:  Right eye: No discharge.        Left eye: No discharge.     Conjunctiva/sclera: Conjunctivae normal.     Pupils: Pupils are equal, round, and reactive to light.  Neck:     Thyroid: No thyromegaly.  Cardiovascular:     Rate and Rhythm: Normal rate and regular rhythm.     Heart sounds: Normal heart sounds. No murmur heard.   Pulmonary:     Effort: Pulmonary effort is normal. No respiratory distress.     Breath sounds: Normal breath sounds. No wheezing or rales.  Abdominal:     General: Bowel sounds are normal. There is no distension.     Palpations: Abdomen is soft. There is no mass.     Tenderness: There is no abdominal tenderness.  Musculoskeletal:        General: No tenderness. Normal range of motion.     Cervical back: Normal range of motion and neck supple.     Right lower leg: No edema.     Left lower leg: No edema.     Comments: Upper / Lower Extremities: - Normal muscle tone, strength bilateral upper extremities 5/5, lower extremities 5/5  Lymphadenopathy:     Cervical: No cervical adenopathy.  Skin:    General: Skin is warm and dry.     Findings: No erythema or rash.  Neurological:     Mental Status: She is alert and oriented to person, place, and time.     Comments: Distal sensation intact to light touch all  extremities  Psychiatric:        Behavior: Behavior normal.     Comments: Well groomed, good eye contact, normal speech and thoughts    Results for orders placed or performed in visit on 07/16/20  Hepatitis C antibody  Result Value Ref Range   Hepatitis C Ab NON-REACTIVE NON-REACTI   SIGNAL TO CUT-OFF 0.03 <1.00  Uric acid  Result Value Ref Range   Uric Acid, Serum 5.7 2.5 - 7.0 mg/dL  TSH  Result Value Ref Range   TSH 2.27 mIU/L  Lipid panel  Result Value Ref Range   Cholesterol 183 <200 mg/dL   HDL 45 (L) > OR = 50 mg/dL   Triglycerides 198 (H) <150 mg/dL   LDL Cholesterol (Calc) 106 (H) mg/dL (calc)   Total CHOL/HDL Ratio 4.1 <5.0 (calc)   Non-HDL Cholesterol (Calc) 138 (H) <130 mg/dL (calc)  COMPLETE METABOLIC PANEL WITH GFR  Result Value Ref Range   Glucose, Bld 137 (H) 65 - 99 mg/dL   BUN 16 7 - 25 mg/dL   Creat 0.53 0.50 - 1.10 mg/dL   GFR, Est Non African American 119 > OR = 60 mL/min/1.71m   GFR, Est African American 138 > OR = 60 mL/min/1.744m  BUN/Creatinine Ratio NOT APPLICABLE 6 - 22 (calc)   Sodium 138 135 - 146 mmol/L   Potassium 4.0 3.5 - 5.3 mmol/L   Chloride 102 98 - 110 mmol/L   CO2 26 20 - 32 mmol/L   Calcium 9.4 8.6 - 10.2 mg/dL   Total Protein 7.6 6.1 - 8.1 g/dL   Albumin 4.4 3.6 - 5.1 g/dL   Globulin 3.2 1.9 - 3.7 g/dL (calc)   AG Ratio 1.4 1.0 - 2.5 (calc)   Total Bilirubin 0.5 0.2 - 1.2 mg/dL   Alkaline phosphatase (APISO) 87 31 - 125 U/L   AST 18 10 - 30 U/L   ALT 29 6 - 29 U/L  CBC with  Differential/Platelet  Result Value Ref Range   WBC 7.7 3.8 - 10.8 Thousand/uL   RBC 4.50 3.80 - 5.10 Million/uL   Hemoglobin 13.2 11.7 - 15.5 g/dL   HCT 39.7 35.0 - 45.0 %   MCV 88.2 80.0 - 100.0 fL   MCH 29.3 27.0 - 33.0 pg   MCHC 33.2 32.0 - 36.0 g/dL   RDW 13.6 11.0 - 15.0 %   Platelets 278 140 - 400 Thousand/uL   MPV 11.2 7.5 - 12.5 fL   Neutro Abs 4,327 1,500 - 7,800 cells/uL   Lymphs Abs 2,302 850 - 3,900 cells/uL   Absolute Monocytes 539  200 - 950 cells/uL   Eosinophils Absolute 470 15 - 500 cells/uL   Basophils Absolute 62 0 - 200 cells/uL   Neutrophils Relative % 56.2 %   Total Lymphocyte 29.9 %   Monocytes Relative 7.0 %   Eosinophils Relative 6.1 %   Basophils Relative 0.8 %  Hemoglobin A1c  Result Value Ref Range   Hgb A1c MFr Bld 6.5 (H) <5.7 % of total Hgb   Mean Plasma Glucose 140 mg/dL   eAG (mmol/L) 7.7 mmol/L      Assessment & Plan:   Problem List Items Addressed This Visit    Type 2 diabetes mellitus with other specified complication (HCC)    Stable Q6V, 6.5 Complications - other including hyperlipidemia, depression, morbid obesity - increases risk of future cardiovascular complications and poor glucose control due to reduced lifestyle diet/exercise with low energy mood and fatigue  Plan:  1. Continue Ozempic 20m weekly inj - Continue Metformin 10035mdaily in AM only 2. Encourage improved lifestyle - may continue low carb, low sugar diet, reduce portion size, continue improving regular exercise 3. Check CBG, bring log to next visit for review 4. Continue ARB      Relevant Medications   losartan (COZAAR) 25 MG tablet   metFORMIN (GLUCOPHAGE) 500 MG tablet   Recurrent major depressive disorder, in full remission ([facility]   Stable, controlled mood on SSRI - in remission No current psychiatry  Plan Continue current Escitalopram 101maily, new rx sent for 52m17mbs      Relevant Medications   escitalopram (LEXAPRO) 10 MG tablet   Morbid obesity (HCC)   Relevant Medications   metFORMIN (GLUCOPHAGE) 500 MG tablet   Hyperlipidemia associated with type 2 diabetes mellitus (HCC)    Controlled LDL but slightly increased. Elevated TG still but improved Last lipid panel 07/2020 Calculated ASCVD 10 yr risk score low risk  Plan: 1. Discussed ASCVD risk - Recommend Fish Oil omega 3 OTC 1-2g BID wc 2. Encourage improved lifestyle - low carb/cholesterol, reduce portion size, continue improving regular  exercise      Relevant Medications   losartan (COZAAR) 25 MG tablet   metFORMIN (GLUCOPHAGE) 500 MG tablet   Gout    Other Visit Diagnoses    Annual physical exam    -  Primary   Callus of foot          Updated Health Maintenance information Due for Booster COVID Next year 2023 pap smear Reviewed recent lab results with patient Encouraged improvement to lifestyle with diet and exercise Goal of weight loss    Meds ordered this encounter  Medications  . escitalopram (LEXAPRO) 10 MG tablet    Sig: Take 1 tablet (10 mg total) by mouth daily.    Dispense:  90 tablet    Refill:  3  . losartan (COZAAR) 25 MG tablet  Sig: Take 0.5 tablets (12.5 mg total) by mouth daily.    Dispense:  45 tablet    Refill:  3  . metFORMIN (GLUCOPHAGE) 500 MG tablet    Sig: Take 2 tablets (1,000 mg total) by mouth daily with breakfast.    Dispense:  180 tablet    Refill:  3    Dose reduction      Follow up plan: Return in about 6 months (around 01/31/2021) for 6 month follow-up DM A1c, foot calluses.  Nobie Putnam, [facility] 07/31/2020, 2:16 PM

## 2020-07-31 NOTE — Assessment & Plan Note (Signed)
Stable, controlled mood on SSRI - in remission No current psychiatry  Plan Continue current Escitalopram 10mg  daily, new rx sent for 10mg  tabs

## 2020-08-01 NOTE — Assessment & Plan Note (Signed)
Stable R6E, 6.5 Complications - other including hyperlipidemia, depression, morbid obesity - increases risk of future cardiovascular complications and poor glucose control due to reduced lifestyle diet/exercise with low energy mood and fatigue  Plan:  1. Continue Ozempic 1mg  weekly inj - Continue Metformin 1000mg  daily in AM only 2. Encourage improved lifestyle - may continue low carb, low sugar diet, reduce portion size, continue improving regular exercise 3. Check CBG, bring log to next visit for review 4. Continue ARB

## 2020-08-01 NOTE — Assessment & Plan Note (Signed)
Controlled LDL but slightly increased. Elevated TG still but improved Last lipid panel 07/2020 Calculated ASCVD 10 yr risk score low risk  Plan: 1. Discussed ASCVD risk - Recommend Fish Oil omega 3 OTC 1-2g BID wc 2. Encourage improved lifestyle - low carb/cholesterol, reduce portion size, continue improving regular exercise

## 2020-11-25 ENCOUNTER — Other Ambulatory Visit: Payer: Self-pay

## 2020-11-25 ENCOUNTER — Ambulatory Visit (INDEPENDENT_AMBULATORY_CARE_PROVIDER_SITE_OTHER): Payer: Self-pay | Admitting: Family Medicine

## 2020-11-25 ENCOUNTER — Encounter: Payer: Self-pay | Admitting: Family Medicine

## 2020-11-25 VITALS — BP 113/57 | HR 108 | Ht 65.0 in | Wt 239.6 lb

## 2020-11-25 DIAGNOSIS — F411 Generalized anxiety disorder: Secondary | ICD-10-CM

## 2020-11-25 DIAGNOSIS — F410 Panic disorder [episodic paroxysmal anxiety] without agoraphobia: Secondary | ICD-10-CM | POA: Insufficient documentation

## 2020-11-25 DIAGNOSIS — F3342 Major depressive disorder, recurrent, in full remission: Secondary | ICD-10-CM

## 2020-11-25 MED ORDER — BUSPIRONE HCL 5 MG PO TABS: 5.0000 mg | ORAL_TABLET | Freq: Three times a day (TID) | ORAL | 2 refills | Status: DC

## 2020-11-25 NOTE — Progress Notes (Signed)
Subjective:    Patient ID: Diana Cunningham, female    DOB: Oct 26, 1980, 40 y.o.   MRN: [id number]  Diana Cunningham is a 40 y.o. female presenting on 11/25/2020 for Anxiety   HPI  Generalized Anxiety with Panic Attacks. DEPRESSION, Major - In Remission:  Multiple life stressors worse in the past 1 month. Her father in law passed away, recently, and her husband was out of state in [facility] with his father who passed. And she was home alone and she felt worsening anxiety, chest pressure, heart racing, shortness of breath during the panic attack. Has not had prior history of panic attacks.  She has concerns with elevated pulse rate, at times with highest HR >139, otherwise can have HR 100-110 sporadically when not doing anything physically active or strenuous. When she has this she feels tightness in chest and heart racing.  Additional life stressors, at work, and recently her mother recently this weekend her mother also triggered her stress and anxiety and caused similar symptoms.  Last visit 07/2020 her Escitalopram was reduced from 20 to 10mg  daily. She has done well on this dose, but now just overwhelmed with anxiety.  Continues Escitalopram 10mg  daily  Never on other medications. Never established with Psychiatry or Psychology therapy.  Denies active symptoms of panic right now Denies chest discomfort, dyspnea, sweating, headache, nausea vomiting    Depression screen [facility] 2/9 11/25/2020 07/31/2020 01/24/2020  Decreased Interest 0 0 0  Down, Depressed, Hopeless 0 0 0  PHQ - 2 Score 0 0 0  Altered sleeping 1 0 1  Tired, decreased energy 1 0 1  Change in appetite 0 0 1  Feeling bad or failure about yourself  0 0 0  Trouble concentrating 1 0 0  Moving slowly or fidgety/restless 0 0 0  Suicidal thoughts 0 0 0  PHQ-9 Score 3 0 3  Difficult doing work/chores Not difficult at all Not difficult at all Not difficult at all  Some recent data might be hidden   GAD 7 : Generalized Anxiety Score  11/25/2020 01/24/2020 06/22/2018  Nervous, Anxious, on Edge 2 0 0  Control/stop worrying 2 1 0  Worry too much - different things 2 1 0  Trouble relaxing 2 0 1  Restless 1 1 0  Easily annoyed or irritable 2 0 1  Afraid - awful might happen 0 0 0  Total GAD 7 Score 11 3 2   Anxiety Difficulty Not difficult at all Not difficult at all Not difficult at all      Social History   Tobacco Use   Smoking status: Former    Packs/day: 1.00    Years: 1.00    Pack years: 1.00    Types: Cigarettes    Quit date: 05/10/1999    Years since quitting: 21.5   Smokeless tobacco: Former  Substance Use Topics   Alcohol use: Yes    Alcohol/week: 0.0 standard drinks    Comment: on occasion   Drug use: No    Review of Systems Per HPI unless specifically indicated above     Objective:    BP (!) 113/57   Pulse (!) 108   Ht 5\' 5"  (1.651 m)   Wt 239 lb 9.6 oz (108.7 kg)   SpO2 97%   BMI 39.87 kg/m   Wt Readings from Last 3 Encounters:  11/25/20 239 lb 9.6 oz (108.7 kg)  07/31/20 236 lb 6.4 oz (107.2 kg)  06/22/20 239 lb 12.8 oz (108.8 kg)  Physical Exam Vitals and nursing note reviewed.  Constitutional:      General: She is not in acute distress.    Appearance: Normal appearance. She is well-developed. She is not diaphoretic.     Comments: Well-appearing, comfortable, cooperative  HENT:     Head: Normocephalic and atraumatic.  Eyes:     General:        Right eye: No discharge.        Left eye: No discharge.     Conjunctiva/sclera: Conjunctivae normal.  Cardiovascular:     Rate and Rhythm: Normal rate.  Pulmonary:     Effort: Pulmonary effort is normal.  Skin:    General: Skin is warm and dry.     Findings: No erythema or rash.  Neurological:     Mental Status: She is alert and oriented to person, place, and time.  Psychiatric:        Mood and Affect: Mood normal.        Behavior: Behavior normal.        Thought Content: Thought content normal.     Comments: Well groomed,  good eye contact, normal speech and thoughts. Mildly anxious appearing but cooperative today. Mood is good     Results for orders placed or performed in visit on 07/16/20  Hepatitis C antibody  Result Value Ref Range   Hepatitis C Ab NON-REACTIVE NON-REACTI   SIGNAL TO CUT-OFF 0.03 <1.00  Uric acid  Result Value Ref Range   Uric Acid, Serum 5.7 2.5 - 7.0 mg/dL  TSH  Result Value Ref Range   TSH 2.27 mIU/L  Lipid panel  Result Value Ref Range   Cholesterol 183 <200 mg/dL   HDL 45 (L) > OR = 50 mg/dL   Triglycerides 198 (H) <150 mg/dL   LDL Cholesterol (Calc) 106 (H) mg/dL (calc)   Total CHOL/HDL Ratio 4.1 <5.0 (calc)   Non-HDL Cholesterol (Calc) 138 (H) <130 mg/dL (calc)  COMPLETE METABOLIC PANEL WITH GFR  Result Value Ref Range   Glucose, Bld 137 (H) 65 - 99 mg/dL   BUN 16 7 - 25 mg/dL   Creat 0.53 0.50 - 1.10 mg/dL   GFR, Est Non African American 119 > OR = 60 mL/min/1.54m2   GFR, Est African American 138 > OR = 60 mL/min/1.62m2   BUN/Creatinine Ratio NOT APPLICABLE 6 - 22 (calc)   Sodium 138 135 - 146 mmol/L   Potassium 4.0 3.5 - 5.3 mmol/L   Chloride 102 98 - 110 mmol/L   CO2 26 20 - 32 mmol/L   Calcium 9.4 8.6 - 10.2 mg/dL   Total Protein 7.6 6.1 - 8.1 g/dL   Albumin 4.4 3.6 - 5.1 g/dL   Globulin 3.2 1.9 - 3.7 g/dL (calc)   AG Ratio 1.4 1.0 - 2.5 (calc)   Total Bilirubin 0.5 0.2 - 1.2 mg/dL   Alkaline phosphatase (APISO) 87 31 - 125 U/L   AST 18 10 - 30 U/L   ALT 29 6 - 29 U/L  CBC with Differential/Platelet  Result Value Ref Range   WBC 7.7 3.8 - 10.8 Thousand/uL   RBC 4.50 3.80 - 5.10 Million/uL   Hemoglobin 13.2 11.7 - 15.5 g/dL   HCT 39.7 35.0 - 45.0 %   MCV 88.2 80.0 - 100.0 fL   MCH 29.3 27.0 - 33.0 pg   MCHC 33.2 32.0 - 36.0 g/dL   RDW 13.6 11.0 - 15.0 %   Platelets 278 140 - 400 Thousand/uL   MPV 11.2  7.5 - 12.5 fL   Neutro Abs 4,327 1,500 - 7,800 cells/uL   Lymphs Abs 2,302 850 - 3,900 cells/uL   Absolute Monocytes 539 200 - 950 cells/uL    Eosinophils Absolute 470 15 - 500 cells/uL   Basophils Absolute 62 0 - 200 cells/uL   Neutrophils Relative % 56.2 %   Total Lymphocyte 29.9 %   Monocytes Relative 7.0 %   Eosinophils Relative 6.1 %   Basophils Relative 0.8 %  Hemoglobin A1c  Result Value Ref Range   Hgb A1c MFr Bld 6.5 (H) <5.7 % of total Hgb   Mean Plasma Glucose 140 mg/dL   eAG (mmol/L) 7.7 mmol/L      Assessment & Plan:   Problem List Items Addressed This Visit     Recurrent major depressive disorder, in full remission ([place])    Stable, controlled mood on SSRI - in remission No current psychiatry  Now with flared anxiety / panic see A&P  Plan Continue current Escitalopram 10mg  daily       Relevant Medications   busPIRone (BUSPAR) 5 MG tablet   Generalized anxiety disorder with panic attacks - Primary    Recently worse flared GAD w/ panic Previously mostly controlled w/ SSRI Now mood is controlled Multiple triggers/life stressors recently, lost family members passing and work  Continue Escitalopram 10mg  daily, no dose increase back to 20mg  at this time  Will add Buspar 5mg  2-3 times a day, reviewed dosing and benefit  Handout given for therapists/counselors for CBT techniques and anxiety assistance, she can look into and contact / request referral if need.  With tachycardia would consider add low dose BB Atenolol vs Metoprolol, even PRN if need in future as next line option.       Relevant Medications   busPIRone (BUSPAR) 5 MG tablet    Meds ordered this encounter  Medications   busPIRone (BUSPAR) 5 MG tablet    Sig: Take 1 tablet (5 mg total) by mouth 3 (three) times daily. Adjust as needed for anxiety    Dispense:  90 tablet    Refill:  2      Follow up plan: Return if symptoms worsen or fail to improve, for keep apt in October.   Nobie Putnam, DO [facility] 11/25/2020, 8:12 AM

## 2020-11-25 NOTE — Assessment & Plan Note (Addendum)
Recently worse flared GAD w/ panic Previously mostly controlled w/ SSRI Now mood is controlled Multiple triggers/life stressors recently, lost family members passing and work  Continue Escitalopram 10mg  daily, no dose increase back to 20mg  at this time  Will add Buspar 5mg  2-3 times a day, reviewed dosing and benefit  Handout given for therapists/counselors for CBT techniques and anxiety assistance, she can look into and contact / request referral if need.  With tachycardia would consider add low dose BB Atenolol vs Metoprolol, even PRN if need in future as next line option.

## 2020-11-25 NOTE — Assessment & Plan Note (Signed)
Stable, controlled mood on SSRI - in remission No current psychiatry  Now with flared anxiety / panic see A&P  Plan Continue current Escitalopram 10mg  daily

## 2020-11-25 NOTE — Patient Instructions (Addendum)
Thank you for coming to the office today.  Add new med Buspar 5mg  up to 2-3 times a day, fairly regularly, and eventually when improved can scale back or take more on an as needed basis.  Future for heart rate, if still elevated persistently >100 or with palpitations, we would consider a beta blocker mild BP medication that can slow heart rate and actually curb anxiety some as well. Consider heart doctor otherwise we can hold off on this for now.  Consider "Mindfulness Techniques" to help de-escalate anxiety.  THERAPIST ONLY   [facility] [address], [place] [address] Johnnette Litter P: [phone]  [address], [address], [place] [address]: [place].   Address: [place], [place], [place] [address] Hours: Open today  9AM-7PM Phone: [phone]  [facility] [address], [place] Address: [address] [place], [place], [place] [address] Phone: [phone]  [facility] [place] Address: [address] [place], [place] [address] [email or web address] Phone: [phone]  [facility]All ages) [address] [place], [id number] Phone: [phone] (Option 1) [email or web address]  [facility]) [place] [address] [place], [place] [address] Phone: [phone]  -----------------------------------------------------------------   Please schedule a Follow-up Appointment to: Return if symptoms worsen or fail to improve, for keep apt in October.  If you have any other questions or concerns, please feel free to call the office or send a message through [place]. You may also schedule an earlier appointment if necessary.  Additionally, you may be receiving a survey about your experience at our office within a few days to 1 week by e-mail or mail.  We value your feedback.  Nobie Putnam, DO [place]

## 2021-02-12 ENCOUNTER — Encounter: Payer: Self-pay | Admitting: Family Medicine

## 2021-02-12 ENCOUNTER — Ambulatory Visit (INDEPENDENT_AMBULATORY_CARE_PROVIDER_SITE_OTHER): Payer: 59 | Admitting: Family Medicine

## 2021-02-12 ENCOUNTER — Other Ambulatory Visit: Payer: Self-pay

## 2021-02-12 VITALS — BP 150/87 | HR 100 | Wt 244.6 lb

## 2021-02-12 DIAGNOSIS — E1169 Type 2 diabetes mellitus with other specified complication: Secondary | ICD-10-CM

## 2021-02-12 DIAGNOSIS — Z23 Encounter for immunization: Secondary | ICD-10-CM

## 2021-02-12 DIAGNOSIS — J302 Other seasonal allergic rhinitis: Secondary | ICD-10-CM

## 2021-02-12 LAB — POCT GLYCOSYLATED HEMOGLOBIN (HGB A1C): Hemoglobin A1C: 7.1 % — AB (ref 4.0–5.6)

## 2021-02-12 MED ORDER — MONTELUKAST SODIUM 10 MG PO TABS: 10.0000 mg | ORAL_TABLET | Freq: Every day | ORAL | 3 refills | Status: DC

## 2021-02-12 NOTE — Assessment & Plan Note (Signed)
Elevated K1S up to 7.1 Complications - other including hyperlipidemia, depression, morbid obesity - increases risk of future cardiovascular complications and poor glucose control due to reduced lifestyle diet/exercise with low energy mood and fatigue  Plan:  1. Discussed dose inc Ozempic from 1 to 2mg , weekly inj -  She can double up on 1mg  currently trial and notify us if want to try 2mg  order, we are out of 2mg  samples  - She has some intolerance to 1mg  in past but now improved use with caution  - Continue Metformin 1000mg  daily in AM only 2. Encourage improved lifestyle - may continue low carb, low sugar diet, reduce portion size, continue improving regular exercise 3. Check CBG, bring log to next visit for review 4. Continue ARB

## 2021-02-12 NOTE — Patient Instructions (Addendum)
Try ozempic 1mg  x 2 doses, one in each side each week. Let me know if tolerating and working we can order 2mg  from pharmacy or we can try to obtain a sample as well temporarily for you to try.  Future can even consider the new medication Mounjaro for diabetes and wt management, similar but improved version of ozempic  Please schedule a Follow-up Appointment to: Return in about 4 months (around 06/15/2021) for 4 month DM A1c.  If you have any other questions or concerns, please feel free to call the office or send a message through [place]. You may also schedule an earlier appointment if necessary.  Additionally, you may be receiving a survey about your experience at our office within a few days to 1 week by e-mail or mail. We value your feedback.  Nobie Putnam, DO [place]

## 2021-02-12 NOTE — Progress Notes (Signed)
Subjective:    Patient ID: Diana Cunningham, female    DOB: 10-Mar-1981, 40 y.o.   MRN: [id number]  Diana Cunningham is a 40 y.o. female presenting on 02/12/2021 for Diabetes and Anxiety   HPI  CHRONIC DM, Type 2 / Morbid Obesity BMI >40 w/ comorbidities A1c up to 7.1 today Improved on GLP1 CBGs: Avg 100-140. No low readings Meds: Ozempic 1mg  [place] weekly inj, Metformin 1000mg  BID Tolerating med well, no further nausea side effect Currently on ARB (Losartan half tab dose 12.5mg ) Lifestyle: - Diet (goals to improve diet healthy options low carb, doing more 310 shakes) - Exercise (goal to resume exercise) - ophthalmology exam, [facility] Dr Harle [facility] May 2022 Foot Callus bilateral. Denies hypoglycemia, polyuria, visual changes, numbness or tingling   Generalized Anxiety with Panic Attacks. DEPRESSION, Major - In Remission:   Multiple life stressors reviewed last visit 11/2020 She was started on Buspar 5mg  TID PRN taking it regularly with good results. If skips dose or miss will feel some anxiety return Continues Escitalopram 10mg  daily   Never on other medications. Never established with Psychiatry or Psychology therapy.   Denies active symptoms of panic right now Denies chest discomfort, dyspnea, sweating, headache, nausea vomiting   Dyspnea on Exertion / vs Exercise Induced Asthma Off singulair Not on inhaler Not having wheezing or significant dyspnea otherwise, but seems provoked with new boxing exercise regimen, requires her to rest.   Health Maintenance: Flu Shot today  Depression screen [facility] 2/9 11/25/2020 07/31/2020 01/24/2020  Decreased Interest 0 0 0  Down, Depressed, Hopeless 0 0 0  PHQ - 2 Score 0 0 0  Altered sleeping 1 0 1  Tired, decreased energy 1 0 1  Change in appetite 0 0 1  Feeling bad or failure about yourself  0 0 0  Trouble concentrating 1 0 0  Moving slowly or fidgety/restless 0 0 0  Suicidal thoughts 0 0 0  PHQ-9 Score 3 0 3  Difficult doing  work/chores Not difficult at all Not difficult at all Not difficult at all  Some recent data might be hidden    Social History   Tobacco Use   Smoking status: Former    Packs/day: 1.00    Years: 1.00    Pack years: 1.00    Types: Cigarettes    Quit date: 05/10/1999    Years since quitting: 21.7   Smokeless tobacco: Former  Substance Use Topics   Alcohol use: Yes    Alcohol/week: 0.0 standard drinks    Comment: on occasion   Drug use: No    Review of Systems Per HPI unless specifically indicated above     Objective:    BP (!) 150/87   Pulse 100   Wt 244 lb 9.6 oz (110.9 kg)   SpO2 97%   BMI 40.70 kg/m   Wt Readings from Last 3 Encounters:  02/12/21 244 lb 9.6 oz (110.9 kg)  11/25/20 239 lb 9.6 oz (108.7 kg)  07/31/20 236 lb 6.4 oz (107.2 kg)    Physical Exam Vitals and nursing note reviewed.  Constitutional:      General: She is not in acute distress.    Appearance: Normal appearance. She is well-developed. She is obese. She is not diaphoretic.     Comments: Well-appearing, comfortable, cooperative  HENT:     Head: Normocephalic and atraumatic.  Eyes:     General:        Right eye: No discharge.  Left eye: No discharge.     Conjunctiva/sclera: Conjunctivae normal.  Cardiovascular:     Rate and Rhythm: Normal rate.  Pulmonary:     Effort: Pulmonary effort is normal. No respiratory distress.     Breath sounds: Normal breath sounds. No wheezing, rhonchi or rales.  Skin:    General: Skin is warm and dry.     Findings: No erythema or rash.  Neurological:     Mental Status: She is alert and oriented to person, place, and time.  Psychiatric:        Mood and Affect: Mood normal.        Behavior: Behavior normal.        Thought Content: Thought content normal.     Comments: Well groomed, good eye contact, normal speech and thoughts   Results for orders placed or performed in visit on 02/12/21  POCT HgB A1C  Result Value Ref Range   Hemoglobin A1C 7.1  (A) 4.0 - 5.6 %      Assessment & Plan:   Problem List Items Addressed This Visit     Type 2 diabetes mellitus with other specified complication ([place]) - Primary    Elevated R1V up to 7.1 Complications - other including hyperlipidemia, depression, morbid obesity - increases risk of future cardiovascular complications and poor glucose control due to reduced lifestyle diet/exercise with low energy mood and fatigue  Plan:  1. Discussed dose inc Ozempic from 1 to 2mg , weekly inj -  She can double up on 1mg  currently trial and notify us if want to try 2mg  order, we are out of 2mg  samples  - She has some intolerance to 1mg  in past but now improved use with caution  - Continue Metformin 1000mg  daily in AM only 2. Encourage improved lifestyle - may continue low carb, low sugar diet, reduce portion size, continue improving regular exercise 3. Check CBG, bring log to next visit for review 4. Continue ARB      Relevant Orders   POCT HgB A1C (Completed)   Other Visit Diagnoses     Needs flu shot       Relevant Orders   Flu Vaccine QUAD 36+ mos IM (Fluarix, Fluzone & Afluria Quad PF (Completed)   Seasonal allergies       Relevant Medications   montelukast (SINGULAIR) 10 MG tablet       Breathing concern possible exercise induced asthma Clinically lungs clear and no history of wheezing reported Will try daily singulair again to see if resolves, was taking PRN Likely also underlying exercise intolerance with new regimen F/u if unresolved.   Meds ordered this encounter  Medications   montelukast (SINGULAIR) 10 MG tablet    Sig: Take 1 tablet (10 mg total) by mouth at bedtime.    Dispense:  90 tablet    Refill:  3      Follow up plan: Return in about 4 months (around 06/15/2021) for 4 month DM A1c.  Nobie Putnam, [facility] 02/12/2021, 12:55 PM

## 2021-02-22 ENCOUNTER — Other Ambulatory Visit: Payer: Self-pay | Admitting: Family Medicine

## 2021-02-22 DIAGNOSIS — F410 Panic disorder [episodic paroxysmal anxiety] without agoraphobia: Secondary | ICD-10-CM

## 2021-02-22 DIAGNOSIS — F411 Generalized anxiety disorder: Secondary | ICD-10-CM

## 2021-03-10 ENCOUNTER — Encounter: Payer: Self-pay | Admitting: Family Medicine

## 2021-03-25 ENCOUNTER — Encounter: Payer: Self-pay | Admitting: Family Medicine

## 2021-03-25 DIAGNOSIS — E1169 Type 2 diabetes mellitus with other specified complication: Secondary | ICD-10-CM

## 2021-03-25 MED ORDER — OZEMPIC (2 MG/DOSE) 8 MG/3ML ~~LOC~~ SOPN: 2.0000 mg | PEN_INJECTOR | SUBCUTANEOUS | 1 refills | Status: DC

## 2021-04-08 NOTE — Telephone Encounter (Signed)
Waiting for determination

## 2021-04-14 ENCOUNTER — Encounter: Payer: Self-pay | Admitting: Family Medicine

## 2021-04-14 DIAGNOSIS — E1169 Type 2 diabetes mellitus with other specified complication: Secondary | ICD-10-CM

## 2021-04-19 MED ORDER — MOUNJARO 5 MG/0.5ML ~~LOC~~ SOAJ: 5.0000 mg | SUBCUTANEOUS | 1 refills | Status: DC

## 2021-04-19 NOTE — Addendum Note (Signed)
Addended by: Olin Hauser on: 04/19/2021 04:48 PM   Modules accepted: Orders

## 2021-04-21 ENCOUNTER — Telehealth: Payer: 59 | Admitting: Family Medicine

## 2021-05-11 MED ORDER — MOUNJARO 7.5 MG/0.5ML ~~LOC~~ SOAJ: 7.5000 mg | SUBCUTANEOUS | 2 refills | Status: DC

## 2021-05-11 NOTE — Addendum Note (Signed)
Addended by: Olin Hauser on: 05/11/2021 04:22 PM   Modules accepted: Orders

## 2021-05-12 NOTE — Telephone Encounter (Signed)
Just submitted the PA

## 2021-06-02 ENCOUNTER — Telehealth: Payer: Self-pay

## 2021-06-02 NOTE — Telephone Encounter (Signed)
Copied from [place] [phone]. Topic: Appointment Scheduling - Prior Auth Required for Appointment >> Jun 02, 2021 10:08 AM Robina Ade, Helene Kelp D wrote: No appointment has been scheduled. Patient is requesting work-in/same day appointment. Patient has hada bad cold since last Friday and has not gotten better, she would like to be worked in today if possible. Per scheduling protocol, this appointment requires a prior authorization prior to scheduling.  Route to department's PEC pool.   We unfortunately do not have anymore available appts left today. She will need to go to UC to be seen today.

## 2021-06-28 ENCOUNTER — Encounter: Payer: Self-pay | Admitting: Family Medicine

## 2021-06-30 ENCOUNTER — Ambulatory Visit (INDEPENDENT_AMBULATORY_CARE_PROVIDER_SITE_OTHER): Payer: 59 | Admitting: Family Medicine

## 2021-06-30 ENCOUNTER — Encounter: Payer: Self-pay | Admitting: Family Medicine

## 2021-06-30 ENCOUNTER — Other Ambulatory Visit: Payer: Self-pay

## 2021-06-30 VITALS — BP 127/80 | HR 93 | Ht 65.0 in | Wt 237.0 lb

## 2021-06-30 DIAGNOSIS — J9809 Other diseases of bronchus, not elsewhere classified: Secondary | ICD-10-CM

## 2021-06-30 DIAGNOSIS — E785 Hyperlipidemia, unspecified: Secondary | ICD-10-CM

## 2021-06-30 DIAGNOSIS — E1169 Type 2 diabetes mellitus with other specified complication: Secondary | ICD-10-CM

## 2021-06-30 DIAGNOSIS — J189 Pneumonia, unspecified organism: Secondary | ICD-10-CM | POA: Diagnosis not present

## 2021-06-30 DIAGNOSIS — Z0000 Encounter for general adult medical examination without abnormal findings: Secondary | ICD-10-CM

## 2021-06-30 MED ORDER — PREDNISONE 20 MG PO TABS: ORAL_TABLET | ORAL | 0 refills | Status: DC

## 2021-06-30 MED ORDER — ALBUTEROL SULFATE HFA 108 (90 BASE) MCG/ACT IN AERS
2.0000 | INHALATION_SPRAY | RESPIRATORY_TRACT | 2 refills | Status: DC | PRN
Start: 2021-06-30 — End: 2024-03-12

## 2021-06-30 NOTE — Progress Notes (Signed)
Subjective:    Patient ID: [name], female    DOB: 22-Jun-1980, 41 y.o.   MRN: [id number]  Dorina Ribaudo is a 40 y.o. female presenting on 06/30/2021 for Cough   HPI  RUL PNA, CAP Cough, persistent  UC FOLLOW-UP VISIT  Hospital/Location: [facility] Date of ED Visit: 06/03/21  Reason for Presenting to ED: Cough  FOLLOW-UP  - ED provider note and record have been reviewed - [facility] at the time, cough for 1 week with mostly non productive but sinus drainage congestion. COVID testing x 3 all negative. They did Chest X-ray RUL pneumonia, added on Cefpodoxime antibiotic for 7 days, and was already placed on Zpak, given steroid burst, and tessalon.  She has Allergy to Augmentin. With nausea vomiting .  She felt much better after few days, but still felt shortness of breath and winded at times. Then 1 week+ later felt back to normal. Then worsening again in past 1.5 weeks, worse cough at night keeping her awake, now coughing during the day again.  Tried elevating head of the bed without relief.  She feels overall good but does still have the winded dyspnea with exertion and worse cough at night. She feels irritation in throat and tightness in breath. Felt more in chest. Now feels more upper airway.  Still taking CLaritin, Montelukast.  Additional updates improved on Mounjaro. Wt down 18 lbs, sugar similar.    Depression screen [facility] 2/9 06/30/2021 11/25/2020 07/31/2020  Decreased Interest 0 0 0  Down, Depressed, Hopeless 0 0 0  PHQ - 2 Score 0 0 0  Altered sleeping 2 1 0  Tired, decreased energy 2 1 0  Change in appetite 0 0 0  Feeling bad or failure about yourself  0 0 0  Trouble concentrating 0 1 0  Moving slowly or fidgety/restless 0 0 0  Suicidal thoughts 0 0 0  PHQ-9 Score 4 3 0  Difficult doing work/chores Not difficult at all Not difficult at all Not difficult at all  Some recent data might be hidden    Social History   Tobacco Use   Smoking status: Former     Packs/day: 1.00    Years: 1.00    Pack years: 1.00    Types: Cigarettes    Quit date: 05/10/1999    Years since quitting: 22.1   Smokeless tobacco: Former  Substance Use Topics   Alcohol use: Yes    Alcohol/week: 0.0 standard drinks    Comment: on occasion   Drug use: No    Review of Systems Per HPI unless specifically indicated above     Objective:    BP 127/80    Pulse 93    Ht 5\' 5"  (1.651 m)    Wt 237 lb (107.5 kg)    SpO2 100%    BMI 39.44 kg/m   Wt Readings from Last 3 Encounters:  06/30/21 237 lb (107.5 kg)  02/12/21 244 lb 9.6 oz (110.9 kg)  11/25/20 239 lb 9.6 oz (108.7 kg)    Physical Exam Vitals and nursing note reviewed.  Constitutional:      General: She is not in acute distress.    Appearance: She is well-developed. She is obese. She is not diaphoretic.     Comments: Well-appearing, comfortable, cooperative  HENT:     Head: Normocephalic and atraumatic.  Eyes:     General:        Right eye: No discharge.        Left eye:  No discharge.     Conjunctiva/sclera: Conjunctivae normal.  Neck:     Thyroid: No thyromegaly.  Cardiovascular:     Rate and Rhythm: Normal rate and regular rhythm.     Heart sounds: Normal heart sounds. No murmur heard. Pulmonary:     Effort: Pulmonary effort is normal. No respiratory distress.     Breath sounds: Wheezing present. No rales.  Musculoskeletal:        General: Normal range of motion.     Cervical back: Normal range of motion and neck supple.  Lymphadenopathy:     Cervical: No cervical adenopathy.  Skin:    General: Skin is warm and dry.     Findings: No erythema or rash.  Neurological:     Mental Status: She is alert and oriented to person, place, and time.  Psychiatric:        Behavior: Behavior normal.     Comments: Well groomed, good eye contact, normal speech and thoughts    I have personally reviewed the radiology report from 06/03/21 CXR.  XR chest 2 views  Narrative  EXAM: CR Chest, 2  View.  CLINICAL HISTORY: mvaughn (DICOM Hx) / COUGH (Pt comments) (DICOM Hx)   COMPARISON: None provided.   FINDINGS:  LUNGS: Inhomogeneous opacity in the right upper lobe compatible with right upper lobe pneumonia. Recommend follow-up imaging to document resolution.   PLEURAL SPACES: No evidence of pleural effusion or pneumothorax.  MEDIASTINUM: The cardiomediastinal silhouette is within normal limits.  BONES: No acute osseous abnormality.  Impression  Inhomogeneous opacity in the right upper lobe compatible with right upper lobe pneumonia. Recommend follow-up imaging to document resolution.  Electronically signed by: Willette Pa, MD, FACR on 06/03/2021 16:16:32   Results for orders placed or performed in visit on 02/12/21  POCT HgB A1C  Result Value Ref Range   Hemoglobin A1C 7.1 (A) 4.0 - 5.6 %      Assessment & Plan:   Problem List Items Addressed This Visit   None Visit Diagnoses     Community acquired pneumonia of right upper lobe of lung    -  Primary   Relevant Medications   albuterol (VENTOLIN HFA) 108 (90 Base) MCG/ACT inhaler   Other Relevant Orders   DG Chest 2 View   Recurrent bronchospasm       Relevant Medications   albuterol (VENTOLIN HFA) 108 (90 Base) MCG/ACT inhaler   predniSONE (DELTASONE) 20 MG tablet   Other Relevant Orders   DG Chest 2 View       Likely resolving RUL PNA CAP but has lingering bronchospasm Afebrile, reassurance overall on clinical history but persistent cough symptoms No dx asthma but seems to have bronchospasm  Ordered folow up Chest X-ray at outpatient imaging on Friday is fine.  Rx new Albuterol inhaler 2 puffs every 4-6 hours around the clock for next 2-3 days, max up to 5 days then use as needed (ask pharmacist to demonstrate proper inhaler use if needed.  Take steroid course only if need, taper 7 day  Defer antibiotics at this time, likely not needed.   Meds ordered this encounter  Medications    albuterol (VENTOLIN HFA) 108 (90 Base) MCG/ACT inhaler    Sig: Inhale 2 puffs into the lungs every 4 (four) hours as needed for wheezing or shortness of breath.    Dispense:  8 g    Refill:  2   predniSONE (DELTASONE) 20 MG tablet    Sig: Take daily  with food. Start with 60mg  (3 pills) x 2 days, then reduce to 40mg  (2 pills) x 2 days, then 20mg  (1 pill) x 3 days    Dispense:  13 tablet    Refill:  0      Follow up plan: Return if symptoms worsen or fail to improve.   Nobie Putnam, DO [facility] 06/30/2021, 11:29 AM

## 2021-06-30 NOTE — Patient Instructions (Addendum)
Thank you for coming to the office today.  Chest X-ray at outpatient imaging on Friday is fine.   - Use Albuterol inhaler 2 puffs every 4-6 hours around the clock for next 2-3 days, max up to 5 days then use as needed (ask pharmacist to demonstrate proper inhaler use if needed.  Take steroid course only if need  Please schedule a Follow-up Appointment to: Return if symptoms worsen or fail to improve.  If you have any other questions or concerns, please feel free to call the office or send a message through [place]. You may also schedule an earlier appointment if necessary.  Additionally, you may be receiving a survey about your experience at our office within a few days to 1 week by e-mail or mail. We value your feedback.  Nobie Putnam, DO [place]

## 2021-07-02 ENCOUNTER — Ambulatory Visit
Admission: RE | Admit: 2021-07-02 | Discharge: 2021-07-02 | Disposition: A | Discharge status: Home / Self Care | Payer: Managed Care, Other (non HMO) | Attending: Family Medicine | Admitting: Family Medicine

## 2021-07-02 ENCOUNTER — Ambulatory Visit
Admission: RE | Admit: 2021-07-02 | Discharge: 2021-07-02 | Disposition: A | Discharge status: Home / Self Care | Payer: Managed Care, Other (non HMO) | Source: Ambulatory Visit | Attending: Family Medicine | Admitting: Family Medicine

## 2021-07-02 DIAGNOSIS — J9809 Other diseases of bronchus, not elsewhere classified: Secondary | ICD-10-CM | POA: Diagnosis present

## 2021-07-02 DIAGNOSIS — J189 Pneumonia, unspecified organism: Secondary | ICD-10-CM | POA: Insufficient documentation

## 2021-07-12 ENCOUNTER — Encounter: Payer: Self-pay | Admitting: Family Medicine

## 2021-07-12 DIAGNOSIS — E1169 Type 2 diabetes mellitus with other specified complication: Secondary | ICD-10-CM

## 2021-07-12 MED ORDER — MOUNJARO 10 MG/0.5ML ~~LOC~~ SOAJ: 10.0000 mg | SUBCUTANEOUS | 1 refills | Status: DC

## 2021-07-12 NOTE — Addendum Note (Signed)
Addended by: Olin Hauser on: 07/12/2021 06:07 PM ? ? Modules accepted: Orders ? ?

## 2021-08-04 MED ORDER — MOUNJARO 10 MG/0.5ML ~~LOC~~ SOAJ: 10.0000 mg | SUBCUTANEOUS | 1 refills | Status: DC

## 2021-08-04 NOTE — Addendum Note (Signed)
Addended by: Olin Hauser on: 08/04/2021 03:27 PM ? ? Modules accepted: Orders ? ?

## 2021-08-05 ENCOUNTER — Other Ambulatory Visit: Payer: Self-pay | Admitting: Family Medicine

## 2021-08-05 DIAGNOSIS — E1169 Type 2 diabetes mellitus with other specified complication: Secondary | ICD-10-CM

## 2021-08-05 NOTE — Telephone Encounter (Signed)
Requested medications are due for refill today.  yes ? ?Requested medications are on the active medications list.  yes ? ?Last refill. 07/31/2020 #45 3 refills ? ?Future visit scheduled.   yes ? ?Notes to clinic.  Failed refill protocol due to expired labs. ? ? ? ?Requested Prescriptions  ?Pending Prescriptions Disp Refills  ? losartan (COZAAR) 25 MG tablet [Pharmacy Med Name: LOSARTAN '25MG'$  TABLETS] 45 tablet 3  ?  Sig: TAKE 1/2 TABLET(12.5 MG) BY MOUTH DAILY  ?  ? Cardiovascular:  Angiotensin Receptor Blockers Failed - 08/05/2021  6:34 AM  ?  ?  Failed - Cr in normal range and within 180 days  ?  Creat  ?Date Value Ref Range Status  ?07/17/2020 0.53 0.50 - 1.10 mg/dL Final  ?  ?  ?  ?  Failed - K in normal range and within 180 days  ?  Potassium  ?Date Value Ref Range Status  ?07/17/2020 4.0 3.5 - 5.3 mmol/L Final  ?  ?  ?  ?  Passed - Patient is not pregnant  ?  ?  Passed - Last BP in normal range  ?  BP Readings from Last 1 Encounters:  ?06/30/21 127/80  ?  ?  ?  ?  Passed - Valid encounter within last 6 months  ?  Recent Outpatient Visits   ? ?      ? 1 month ago Community acquired pneumonia of right upper lobe of lung  ? [place], DO  ? 5 months ago Type 2 diabetes mellitus with other specified complication, without long-term current use of insulin ([place])  ? [place], DO  ? 8 months ago Generalized anxiety disorder with panic attacks  ? [place], DO  ? 1 year ago Annual physical exam  ? [place], DO  ? 1 year ago Benign paroxysmal positional vertigo of right ear  ? [place], DO  ? ?  ?  ?Future Appointments   ? ?        ? In 4 weeks [place] Devonne Doughty, DO [facility] [place]  ? ?  ? ?  ?  ?  ?  ?

## 2021-08-27 ENCOUNTER — Other Ambulatory Visit: Payer: 59

## 2021-08-27 DIAGNOSIS — Z0000 Encounter for general adult medical examination without abnormal findings: Secondary | ICD-10-CM

## 2021-08-27 DIAGNOSIS — E1169 Type 2 diabetes mellitus with other specified complication: Secondary | ICD-10-CM

## 2021-08-28 LAB — CBC WITH DIFFERENTIAL/PLATELET
Absolute Monocytes: 561 cells/uL (ref 200–950)
Basophils Absolute: 125 cells/uL (ref 0–200)
Basophils Relative: 1.4 %
Eosinophils Absolute: 418 cells/uL (ref 15–500)
Eosinophils Relative: 4.7 %
HCT: 42.2 % (ref 35.0–45.0)
Hemoglobin: 13.7 g/dL (ref 11.7–15.5)
Lymphs Abs: 2456 cells/uL (ref 850–3900)
MCH: 28.5 pg (ref 27.0–33.0)
MCHC: 32.5 g/dL (ref 32.0–36.0)
MCV: 87.9 fL (ref 80.0–100.0)
MPV: 10.9 fL (ref 7.5–12.5)
Monocytes Relative: 6.3 %
Neutro Abs: 5340 cells/uL (ref 1500–7800)
Neutrophils Relative %: 60 %
Platelets: 316 10*3/uL (ref 140–400)
RBC: 4.8 10*6/uL (ref 3.80–5.10)
RDW: 13.4 % (ref 11.0–15.0)
Total Lymphocyte: 27.6 %
WBC: 8.9 10*3/uL (ref 3.8–10.8)

## 2021-08-28 LAB — COMPREHENSIVE METABOLIC PANEL
AG Ratio: 1.4 (calc) (ref 1.0–2.5)
ALT: 26 U/L (ref 6–29)
AST: 21 U/L (ref 10–30)
Albumin: 4.3 g/dL (ref 3.6–5.1)
Alkaline phosphatase (APISO): 96 U/L (ref 31–125)
BUN: 10 mg/dL (ref 7–25)
CO2: 24 mmol/L (ref 20–32)
Calcium: 9.5 mg/dL (ref 8.6–10.2)
Chloride: 100 mmol/L (ref 98–110)
Creat: 0.65 mg/dL (ref 0.50–0.99)
Globulin: 3 g/dL (calc) (ref 1.9–3.7)
Glucose, Bld: 119 mg/dL — ABNORMAL HIGH (ref 65–99)
Potassium: 4.1 mmol/L (ref 3.5–5.3)
Sodium: 136 mmol/L (ref 135–146)
Total Bilirubin: 0.6 mg/dL (ref 0.2–1.2)
Total Protein: 7.3 g/dL (ref 6.1–8.1)

## 2021-08-28 LAB — LIPID PANEL
Cholesterol: 176 mg/dL (ref <200)
HDL: 44 mg/dL — ABNORMAL LOW (ref >=50)
LDL Cholesterol (Calc): 98 mg/dL (calc)
Non-HDL Cholesterol (Calc): 132 mg/dL (calc) — ABNORMAL HIGH (ref <130)
Total CHOL/HDL Ratio: 4 (calc) (ref <5.0)
Triglycerides: 218 mg/dL — ABNORMAL HIGH (ref <150)

## 2021-08-28 LAB — HEMOGLOBIN A1C
Hgb A1c MFr Bld: 6.3 % of total Hgb — ABNORMAL HIGH (ref <5.7)
Mean Plasma Glucose: 134 mg/dL
eAG (mmol/L): 7.4 mmol/L

## 2021-08-28 LAB — TSH: TSH: 0.98 mIU/L

## 2021-09-03 ENCOUNTER — Ambulatory Visit (INDEPENDENT_AMBULATORY_CARE_PROVIDER_SITE_OTHER): Payer: 59 | Admitting: Family Medicine

## 2021-09-03 ENCOUNTER — Other Ambulatory Visit: Payer: Self-pay | Admitting: Family Medicine

## 2021-09-03 ENCOUNTER — Encounter: Payer: Self-pay | Admitting: Family Medicine

## 2021-09-03 VITALS — BP 132/80 | HR 108 | Ht 65.0 in | Wt 225.2 lb

## 2021-09-03 DIAGNOSIS — Z124 Encounter for screening for malignant neoplasm of cervix: Secondary | ICD-10-CM

## 2021-09-03 DIAGNOSIS — E1169 Type 2 diabetes mellitus with other specified complication: Secondary | ICD-10-CM | POA: Diagnosis not present

## 2021-09-03 DIAGNOSIS — F3342 Major depressive disorder, recurrent, in full remission: Secondary | ICD-10-CM

## 2021-09-03 DIAGNOSIS — E785 Hyperlipidemia, unspecified: Secondary | ICD-10-CM

## 2021-09-03 DIAGNOSIS — Z0000 Encounter for general adult medical examination without abnormal findings: Secondary | ICD-10-CM

## 2021-09-03 DIAGNOSIS — F410 Panic disorder [episodic paroxysmal anxiety] without agoraphobia: Secondary | ICD-10-CM

## 2021-09-03 MED ORDER — BUSPIRONE HCL 5 MG PO TABS: 5.0000 mg | ORAL_TABLET | Freq: Three times a day (TID) | ORAL | 3 refills | Status: DC | PRN

## 2021-09-03 MED ORDER — MOUNJARO 10 MG/0.5ML ~~LOC~~ SOAJ: 10.0000 mg | SUBCUTANEOUS | 1 refills | Status: DC

## 2021-09-03 NOTE — Assessment & Plan Note (Signed)
Stable, controlled mood on SSRI - in remission ?No current psychiatry ? ?Now with flared anxiety / panic see A&P ? ?Plan ?Continue current Escitalopram '10mg'$  daily ?

## 2021-09-03 NOTE — Progress Notes (Signed)
? ?Subjective:  ? ? Patient ID: Diana Cunningham, female    DOB: [birth year], 41 y.o.   MRN: [id number] ? ?Diana Cunningham is a 41 y.o. female presenting on 09/03/2021 for Annual Exam ? ? ?HPI ? ?Here for Annual Physical and Lab Review. ? ?CHRONIC DM, Type 2 / Morbid Obesity BMI >37 w comorbidities ?Hyperlipidemia ? ?Interval history with major improvement now on higher dose Mounjaro, previously on higher dose Ozempic limited results. ?A1c down to 6.3, and wt down 1 lbs. ?CBGs: Avg 90-120. No low readings ?Meds: Mounjaro 4m weekly, Metformin 10067mBID ?Tolerating med well, no further nausea side effect ?Currently on ARB (Losartan half tab dose 12.87m687m?Lifestyle: ?Weight down 20-25 lbs in past 6 months ?- Diet (goals to improve diet healthy options low carb, doing more 310 shakes) ?- Exercise (goal to resume exercise) ?- ophthalmology exam, [facility] Summer 2023 ?Foot Callus bilateral. ?Denies hypoglycemia, polyuria, visual changes, numbness or tingling ?  ?  ?Generalized Anxiety with Panic Attacks. ?DEPRESSION, Major - In Remission: ?  ?Multiple life stressors reviewed last visit 11/2020 ?She was started on Buspar 87mg57mD PRN taking it regularly with good results. If skips dose or miss will feel some anxiety return ?Continues Escitalopram 10mg12mly ?  ?Never on other medications. Never established with Psychiatry or Psychology therapy. ?  ?Denies active symptoms of panic right now ?Denies chest discomfort, dyspnea, sweating, headache, nausea vomiting  ?  ? ?  ?Health Maintenance: ? ?Due for Pap Smear. Last 2020/ negative. ?Due for DM Eye ? ? ? ?  09/03/2021  ?  1:42 PM 06/30/2021  ? 11:40 AM 11/25/2020  ?  8:05 AM  ?Depression screen PHQ 2/9  ?Decreased Interest 0 0 0  ?Down, Depressed, Hopeless 0 0 0  ?PHQ - 2 Score 0 0 0  ?Altered sleeping 0 2 1  ?Tired, decreased energy 0 2 1  ?Change in appetite 0 0 0  ?Feeling bad or failure about yourself  0 0 0  ?Trouble concentrating 0 0 1  ?Moving slowly or fidgety/restless 0  0 0  ?Suicidal thoughts 0 0 0  ?PHQ-9 Score 0 4 3  ?Difficult doing work/chores Not difficult at all Not difficult at all Not difficult at all  ? ? ?Past Medical History:  ?Diagnosis Date  ? Pilonidal cyst   ? ?Past Surgical History:  ?Procedure Laterality Date  ? PILONIDAL CYST EXCISION  2005  ? ?Social History  ? ?Socioeconomic History  ? Marital status: Single  ?  Spouse name: Not on file  ? Number of children: Not on file  ? Years of education: Not on file  ? Highest education level: Not on file  ?Occupational History  ? Not on file  ?Tobacco Use  ? Smoking status: Former  ?  Packs/day: 1.00  ?  Years: 1.00  ?  Pack years: 1.00  ?  Types: Cigarettes  ?  Quit date: 05/10/1999  ?  Years since quitting: 22.3  ? Smokeless tobacco: Former  ?Substance and Sexual Activity  ? Alcohol use: Yes  ?  Alcohol/week: 0.0 standard drinks  ?  Comment: on occasion  ? Drug use: No  ? Sexual activity: Yes  ?Other Topics Concern  ? Not on file  ?Social History Narrative  ? Not on file  ? ?Social Determinants of Health  ? ?Financial Resource Strain: Not on file  ?Food Insecurity: Not on file  ?Transportation Needs: Not on file  ?Physical Activity: Not on file  ?Stress:  Not on file  ?Social Connections: Not on file  ?Intimate Partner Violence: Not on file  ? ?Family History  ?Problem Relation Age of Onset  ? Hypertension Mother   ? Depression Mother   ? Basal cell carcinoma Mother   ? Breast cancer Neg Hx   ? Colon cancer Neg Hx   ? ?Current Outpatient Medications on File Prior to Visit  ?Medication Sig  ? albuterol (VENTOLIN HFA) 108 (90 Base) MCG/ACT inhaler Inhale 2 puffs into the lungs every 4 (four) hours as needed for wheezing or shortness of breath.  ? Blood Glucose Monitoring Suppl (ONE TOUCH ULTRA 2) w/Device KIT OneTouch Ultra2 Meter kit ? TK UTD BID  ? escitalopram (LEXAPRO) 10 MG tablet Take 1 tablet (10 mg total) by mouth daily.  ? Insulin Pen Needle (NOVOFINE PLUS) 32G X 4 MM MISC Use with Ozempic weekly injection as  instructed  ? Loratadine (CLARITIN PO) Claritin  ? losartan (COZAAR) 25 MG tablet TAKE 1/2 TABLET(12.5 MG) BY MOUTH DAILY  ? meclizine (ANTIVERT) 25 MG tablet Take 1 tablet (25 mg total) by mouth 3 (three) times daily as needed for dizziness.  ? metFORMIN (GLUCOPHAGE) 500 MG tablet Take 2 tablets (1,000 mg total) by mouth daily with breakfast.  ? montelukast (SINGULAIR) 10 MG tablet Take 1 tablet (10 mg total) by mouth at bedtime.  ? ondansetron (ZOFRAN-ODT) 4 MG disintegrating tablet DISSOLVE 1 TABLET(4 MG) ON THE TONGUE EVERY 8 HOURS AS NEEDED FOR NAUSEA OR VOMITING  ? ONETOUCH DELICA LANCETS 63F MISC U UTD BID  ? ?No current facility-administered medications on file prior to visit.  ? ? ?Review of Systems  ?Constitutional:  Negative for activity change, appetite change, chills, diaphoresis, fatigue and fever.  ?HENT:  Negative for congestion and hearing loss.   ?Eyes:  Negative for visual disturbance.  ?Respiratory:  Negative for cough, chest tightness, shortness of breath and wheezing.   ?Cardiovascular:  Negative for chest pain, palpitations and leg swelling.  ?Gastrointestinal:  Negative for abdominal pain, constipation, diarrhea, nausea and vomiting.  ?Genitourinary:  Negative for dysuria, frequency and hematuria.  ?Musculoskeletal:  Negative for arthralgias and neck pain.  ?Skin:  Negative for rash.  ?Neurological:  Negative for dizziness, weakness, light-headedness, numbness and headaches.  ?Hematological:  Negative for adenopathy.  ?Psychiatric/Behavioral:  Negative for behavioral problems, dysphoric mood and sleep disturbance.   ?Per HPI unless specifically indicated above ? ? ?   ?Objective:  ?  ?BP 132/80   Pulse (!) 108   Ht '5\' 5"'  (1.651 m)   Wt 225 lb 3.2 oz (102.2 kg)   SpO2 99%   BMI 37.48 kg/m?   ?Wt Readings from Last 3 Encounters:  ?09/03/21 225 lb 3.2 oz (102.2 kg)  ?06/30/21 237 lb (107.5 kg)  ?02/12/21 244 lb 9.6 oz (110.9 kg)  ?  ?Physical Exam ?Vitals and nursing note reviewed.   ?Constitutional:   ?   General: She is not in acute distress. ?   Appearance: She is well-developed. She is obese. She is not diaphoretic.  ?   Comments: Well-appearing, comfortable, cooperative  ?HENT:  ?   Head: Normocephalic and atraumatic.  ?Eyes:  ?   General:     ?   Right eye: No discharge.     ?   Left eye: No discharge.  ?   Conjunctiva/sclera: Conjunctivae normal.  ?   Pupils: Pupils are equal, round, and reactive to light.  ?Neck:  ?   Thyroid: No thyromegaly.  ?  Vascular: No carotid bruit.  ?Cardiovascular:  ?   Rate and Rhythm: Normal rate and regular rhythm.  ?   Pulses: Normal pulses.  ?   Heart sounds: Normal heart sounds. No murmur heard. ?Pulmonary:  ?   Effort: Pulmonary effort is normal. No respiratory distress.  ?   Breath sounds: Normal breath sounds. No wheezing or rales.  ?Abdominal:  ?   General: Bowel sounds are normal. There is no distension.  ?   Palpations: Abdomen is soft. There is no mass.  ?   Tenderness: There is no abdominal tenderness.  ?Musculoskeletal:     ?   General: No tenderness. Normal range of motion.  ?   Cervical back: Normal range of motion and neck supple.  ?   Right lower leg: No edema.  ?   Left lower leg: No edema.  ?   Comments: Upper / Lower Extremities: ?- Normal muscle tone, strength bilateral upper extremities 5/5, lower extremities 5/5  ?Lymphadenopathy:  ?   Cervical: No cervical adenopathy.  ?Skin: ?   General: Skin is warm and dry.  ?   Findings: No erythema or rash.  ?Neurological:  ?   Mental Status: She is alert and oriented to person, place, and time.  ?   Comments: Distal sensation intact to light touch all extremities  ?Psychiatric:     ?   Mood and Affect: Mood normal.     ?   Behavior: Behavior normal.     ?   Thought Content: Thought content normal.  ?   Comments: Well groomed, good eye contact, normal speech and thoughts  ? ? ?Diabetic Foot Exam - Simple   ?Simple Foot Form ?Diabetic Foot exam was performed with the following findings: Yes  09/03/2021  2:06 PM  ?Visual Inspection ?No deformities, no ulcerations, no other skin breakdown bilaterally: Yes ?Sensation Testing ?Intact to touch and monofilament testing bilaterally: Yes ?Pulse Check ?Posterior Tibialis

## 2021-09-03 NOTE — Assessment & Plan Note (Signed)
BMI >37 w comorbidities ?DM2, HLD HTN Gout ?Encourage improved lifestyle wt loss, on GLP/GIP therapy ?

## 2021-09-03 NOTE — Patient Instructions (Addendum)
Thank you for coming to the office today. ? ?Recent Labs  ?  02/12/21 ?1255 08/27/21 ?0829  ?HGBA1C 7.1* 6.3*  ? ?Keep up the great work with the [facility] '10mg'$  weekly injection. No dose change. ? ?Reduce Metformin gradually if interested, can go from 2 tabs in morning down to 1 tab daily. Can even consider a XR 24 hr tablet if prefer, or... discontinue! ? ?[employer] ?[place], [place] [address] ?Phone: [phone] ? ?[place]   ?Address: [address] [place], [place], [place], [place] [address] ?Hours: 8AM-5PM ?Phone: [phone] ? ?Sent Mounjaro '10mg'$  - [facility] = 84 day order with 1 refill ? ?Please schedule a Follow-up Appointment to: Return in about 6 months (around 03/05/2022) for 6 month DM A1c. ? ?If you have any other questions or concerns, please feel free to call the office or send a message through [place]. You may also schedule an earlier appointment if necessary. ? ?Additionally, you may be receiving a survey about your experience at our office within a few days to 1 week by e-mail or mail. We value your feedback. ? ?Nobie Putnam, DO ?[place] ?

## 2021-09-03 NOTE — Assessment & Plan Note (Signed)
Controlled LDL, elevated TG ?Last lipid panel 08/2021 ?Calculated ASCVD 10 yr risk score low risk ? ?Plan: ?1. Reconsider statin therapy in future if indicated. ?Can take Omega 3 fish oil ?2. Encourage improved lifestyle - low carb/cholesterol, reduce portion size, continue improving regular exercise ?

## 2021-09-03 NOTE — Assessment & Plan Note (Signed)
Excellent improved DM control on Mounjaro and lifestyle ?A1c to 6.3 ?Complications - other including hyperlipidemia, depression, morbid obesity - increases risk of future cardiovascular complications and poor glucose control due to reduced lifestyle diet/exercise with low energy mood and fatigue ?OFF Ozempic ? ?Plan:  ?1. Continue Mounjaro '10mg'$  weekly inj, new rx 84 day supply sent to [facility] ?- Continue Metformin '1000mg'$  daily in AM only - we discussed can taper down to '500mg'$  daily or we can do XR or phase off completely if doing well ?2. Encourage improved lifestyle - may continue low carb, low sugar diet, reduce portion size, continue improving regular exercise ?3. Check CBG, bring log to next visit for review ?4. Continue ARB ?DM Foot check ?[facility] Summer ?

## 2021-09-08 ENCOUNTER — Telehealth: Payer: Self-pay

## 2021-09-08 NOTE — Telephone Encounter (Signed)
Diana Cunningham medical referring for for annual exam with papin 4-6 weeks.sch with CNM or PA. Called and left voicemail for patient to call back to be scheduled.

## 2021-09-08 NOTE — Telephone Encounter (Signed)
-----   Message from Excell Seltzer, RN sent at 09/08/2021 12:40 PM EDT ----- Regarding: referral Schedule with any APP for annual exam with papin 4-6 weeks.

## 2021-09-09 NOTE — Telephone Encounter (Signed)
Called and left voicemail for patient to call back to be scheduled. 

## 2021-09-10 NOTE — Telephone Encounter (Signed)
Called and left voicemail for patient to call back to be scheduled. 

## 2021-09-14 NOTE — Telephone Encounter (Signed)
Called and left voicemail for patient to call back to be scheduled. 

## 2021-09-14 NOTE — Telephone Encounter (Signed)
Multiple attempts to reach patient were unsuccessful contacting referring provider.

## 2021-12-01 ENCOUNTER — Other Ambulatory Visit: Payer: Self-pay | Admitting: Family Medicine

## 2021-12-01 DIAGNOSIS — R110 Nausea: Secondary | ICD-10-CM

## 2021-12-02 NOTE — Telephone Encounter (Signed)
Requested medication (s) are due for refill today: Yes  Requested medication (s) are on the active medication list: Yes  Last refill:  03/16/20  Future visit scheduled: Yes  Notes to clinic:  See request.    Requested Prescriptions  Pending Prescriptions Disp Refills   ondansetron (ZOFRAN-ODT) 4 MG disintegrating tablet [Pharmacy Med Name: ONDANSETRON ODT '4MG'$  TABLETS] 30 tablet 2    Sig: DISSOLVE 1 TABLET(4 MG) ON THE TONGUE EVERY 8 HOURS AS NEEDED FOR NAUSEA OR VOMITING     Not Delegated - Gastroenterology: Antiemetics - ondansetron Failed - 12/01/2021 11:14 AM      Failed - This refill cannot be delegated      Passed - AST in normal range and within 360 days    AST  Date Value Ref Range Status  08/27/2021 21 10 - 30 U/L Final         Passed - ALT in normal range and within 360 days    ALT  Date Value Ref Range Status  08/27/2021 26 6 - 29 U/L Final         Passed - Valid encounter within last 6 months    Recent Outpatient Visits           3 months ago Annual physical exam   [place], DO   5 months ago Community acquired pneumonia of right upper lobe of lung   [place], DO   9 months ago Type 2 diabetes mellitus with other specified complication, without long-term current use of insulin [facility])   [place], DO   1 year ago Generalized anxiety disorder with panic attacks   [place], DO   1 year ago Annual physical exam   [place], DO       Future Appointments             In 3 months [place], Devonne Doughty, DO [facility], [facility]

## 2022-02-15 ENCOUNTER — Encounter: Payer: Self-pay | Admitting: Family Medicine

## 2022-02-15 DIAGNOSIS — E1169 Type 2 diabetes mellitus with other specified complication: Secondary | ICD-10-CM

## 2022-02-15 MED ORDER — MOUNJARO 12.5 MG/0.5ML ~~LOC~~ SOAJ: 12.5000 mg | SUBCUTANEOUS | 1 refills | Status: DC

## 2022-03-04 ENCOUNTER — Other Ambulatory Visit: Payer: Self-pay | Admitting: Family Medicine

## 2022-03-04 ENCOUNTER — Encounter: Payer: Self-pay | Admitting: Family Medicine

## 2022-03-04 ENCOUNTER — Ambulatory Visit (INDEPENDENT_AMBULATORY_CARE_PROVIDER_SITE_OTHER): Payer: 59 | Admitting: Family Medicine

## 2022-03-04 VITALS — BP 120/75 | HR 105 | Ht 65.0 in | Wt 195.0 lb

## 2022-03-04 DIAGNOSIS — F411 Generalized anxiety disorder: Secondary | ICD-10-CM | POA: Diagnosis not present

## 2022-03-04 DIAGNOSIS — E669 Obesity, unspecified: Secondary | ICD-10-CM

## 2022-03-04 DIAGNOSIS — Z23 Encounter for immunization: Secondary | ICD-10-CM | POA: Diagnosis not present

## 2022-03-04 DIAGNOSIS — E1169 Type 2 diabetes mellitus with other specified complication: Secondary | ICD-10-CM

## 2022-03-04 DIAGNOSIS — Z0000 Encounter for general adult medical examination without abnormal findings: Secondary | ICD-10-CM

## 2022-03-04 DIAGNOSIS — F3342 Major depressive disorder, recurrent, in full remission: Secondary | ICD-10-CM

## 2022-03-04 DIAGNOSIS — F410 Panic disorder [episodic paroxysmal anxiety] without agoraphobia: Secondary | ICD-10-CM

## 2022-03-04 LAB — POCT GLYCOSYLATED HEMOGLOBIN (HGB A1C): Hemoglobin A1C: 5 % (ref 4.0–5.6)

## 2022-03-04 MED ORDER — ESCITALOPRAM OXALATE 10 MG PO TABS: ORAL_TABLET | ORAL | 0 refills | Status: DC

## 2022-03-04 NOTE — Progress Notes (Signed)
Subjective:    Patient ID: Diana Cunningham, female    DOB: Jan 24, 1981, 41 y.o.   MRN: [id number]  Diana Cunningham is a 41 y.o. female presenting on 03/04/2022 for Diabetes   HPI  CHRONIC DM, Type 2 / Obesity BMI >32 w comorbidities Hyperlipidemia  Significant improvement with weight loss 237 lbs (06/2021) down to 195 lbs (02/2022) With weight loss she admits some hair thinning and loss Mounjaro support group for hair loss and side effects symptoms She is taking Vitamins D, Biotin, add Collagen  Prior A1c 6.3 due today for repeat A1c Significant improved sugars. Meds: Mounjaro 12.'5mg'$  weekly, Metformin '500mg'$  daily - she has already reduced metformin asking to discontinue now Tolerating med well, no further nausea side effect Currently on ARB (Losartan half tab dose 12.'5mg'$ ) Lifestyle: - Diet (significantly improved diet and eating patterns, less craving sugar) - Exercise (increasing activity, no particular regimen currently) - ophthalmology exam, [facility] Summer 2023 - requested copy Foot Callus bilateral. Denies hypoglycemia, polyuria, visual changes, numbness or tingling     Generalized Anxiety with Panic Attacks. DEPRESSION, Major - In Remission:   Multiple life stressors reviewed last visit 11/2020 She was started on Buspar '5mg'$  TID PRN taking it regularly with good results. If skips dose or miss will feel some anxiety return Continues Escitalopram '10mg'$  daily ready to taper off med Escitalopram, she has '20mg'$  cutting in half for '10mg'$  - Has Buspar but not taking it   Never on other medications. Never established with Psychiatry or Psychology therapy.   Denies active symptoms of panic right now Denies chest discomfort, dyspnea, sweating, headache, nausea vomiting   Health Maintenance: Flu Shot     03/04/2022    8:32 AM 09/03/2021    1:42 PM 06/30/2021   11:40 AM  Depression screen PHQ 2/9  Decreased Interest 0 0 0  Down, Depressed, Hopeless 0 0 0  PHQ - 2 Score 0 0  0  Altered sleeping 0 0 2  Tired, decreased energy 1 0 2  Change in appetite 0 0 0  Feeling bad or failure about yourself  0 0 0  Trouble concentrating 0 0 0  Moving slowly or fidgety/restless 0 0 0  Suicidal thoughts 0 0 0  PHQ-9 Score 1 0 4  Difficult doing work/chores Not difficult at all Not difficult at all Not difficult at all      03/04/2022    8:32 AM 09/03/2021    1:42 PM 06/30/2021   11:40 AM 11/25/2020    8:07 AM  GAD 7 : Generalized Anxiety Score  Nervous, Anxious, on Edge 0 0 1 2  Control/stop worrying 0 0 0 2  Worry too much - different things 0 0 0 2  Trouble relaxing 0 0 0 2  Restless 0 0 0 1  Easily annoyed or irritable 0 0 1 2  Afraid - awful might happen 0 0 0 0  Total GAD 7 Score 0 0 2 11  Anxiety Difficulty Not difficult at all Not difficult at all Not difficult at all Not difficult at all      Social History   Tobacco Use   Smoking status: Former    Packs/day: 1.00    Years: 1.00    Total pack years: 1.00    Types: Cigarettes    Quit date: 05/10/1999    Years since quitting: 22.8   Smokeless tobacco: Former  Substance Use Topics   Alcohol use: Yes    Alcohol/week: 0.0 standard drinks  of alcohol    Comment: on occasion   Drug use: No    Review of Systems Per HPI unless specifically indicated above     Objective:    BP 120/75   Pulse (!) 105   Ht '5\' 5"'$  (1.651 m)   Wt 195 lb (88.5 kg)   SpO2 98%   BMI 32.45 kg/m   Wt Readings from Last 3 Encounters:  03/04/22 195 lb (88.5 kg)  09/03/21 225 lb 3.2 oz (102.2 kg)  06/30/21 237 lb (107.5 kg)    Physical Exam Vitals and nursing note reviewed.  Constitutional:      General: She is not in acute distress.    Appearance: She is well-developed. She is obese. She is not diaphoretic.     Comments: Well-appearing, comfortable, cooperative, major weight loss success  HENT:     Head: Normocephalic and atraumatic.  Eyes:     General:        Right eye: No discharge.        Left eye: No  discharge.     Conjunctiva/sclera: Conjunctivae normal.  Neck:     Thyroid: No thyromegaly.  Cardiovascular:     Rate and Rhythm: Normal rate and regular rhythm.     Heart sounds: Normal heart sounds. No murmur heard. Pulmonary:     Effort: Pulmonary effort is normal. No respiratory distress.     Breath sounds: Normal breath sounds. No wheezing or rales.  Musculoskeletal:        General: Normal range of motion.     Cervical back: Normal range of motion and neck supple.  Lymphadenopathy:     Cervical: No cervical adenopathy.  Skin:    General: Skin is warm and dry.     Findings: No erythema or rash.  Neurological:     Mental Status: She is alert and oriented to person, place, and time.  Psychiatric:        Behavior: Behavior normal.     Comments: Well groomed, good eye contact, normal speech and thoughts     Recent Labs    08/27/21 0829 03/04/22 0852  HGBA1C 6.3* 5.0     Results for orders placed or performed in visit on 03/04/22  POCT HgB A1C  Result Value Ref Range   Hemoglobin A1C 5.0 4.0 - 5.6 %      Assessment & Plan:   Problem List Items Addressed This Visit     Generalized anxiety disorder with panic attacks   Relevant Medications   escitalopram (LEXAPRO) 10 MG tablet   Obesity (BMI 30.0-34.9)    Dramatic weight loss w lifestyle and GIP/GLP1 therapy BMI < 35 now      Recurrent major depressive disorder, in full remission ([place])    Stable in remission Anxiety comorbid  Trial off SSRI now  Medication Taper Off Instructions Current med - Escitalopram 10 mg  Week 1-2: Alternate every OTHER day - 10 mg (whole tab) and then next day 5 mg (HALF tab) Week 3-4: 5 mg daily (HALF tablet) Week 5-6: 5 mg every OTHER day (HALF tablet) - alternating day is NO medicine (skip dose) STOP completely      Relevant Medications   escitalopram (LEXAPRO) 10 MG tablet   Type 2 diabetes mellitus with other specified complication (HCC) - Primary    Excellent improved  DM control on Mounjaro and lifestyle A1c 6.3 down to 5.0, very impressive and major weight loss Complications - other including hyperlipidemia, depression, increases risk of future cardiovascular  complications and poor glucose control due to reduced lifestyle diet/exercise with low energy mood and fatigue OFF Ozempic  Plan:  1. Continue Mounjaro 12.'5mg'$  weekly inj - note she had to increase before due to out of stock lower dose. - Note plan to taper down on Mounjaro in future to maintenance dose - DISCONTINUE Metformin completely 2. Encourage improved lifestyle - may continue low carb, low sugar diet, reduce portion size, continue improving regular exercise 3. Check CBG, bring log to next visit for review 4. Continue ARB DM Foot check [facility] in Summer      Relevant Orders   POCT HgB A1C (Completed)   Urine Microalbumin w/creat. ratio   Other Visit Diagnoses     Needs flu shot       Relevant Orders   Flu Vaccine QUAD 72moIM (Fluarix, Fluzone & Alfiuria Quad PF) (Completed)         Orders Placed This Encounter  Procedures   Flu Vaccine QUAD 634moM (Fluarix, Fluzone & Alfiuria Quad PF)   Urine Microalbumin w/creat. ratio   POCT HgB A1C    Meds ordered this encounter  Medications   escitalopram (LEXAPRO) 10 MG tablet    Sig: Taper off medication as advised. Start with '10mg'$  daily, gradually reduce to half tab for '5mg'$  and then discontinue when ready.    Dispense:  90 tablet    Refill:  0     Follow up plan: Return in about 6 months (around 09/03/2022) for 6 month fasting lab only then 1 week later Annual Physical.  Future labs ordered for 08/2022   AlNobie PutnamDOHarroldroup 03/04/2022, 8:46 AM

## 2022-03-04 NOTE — Assessment & Plan Note (Signed)
Stable in remission Anxiety comorbid  Trial off SSRI now  Medication Taper Off Instructions Current med - Escitalopram 10 mg  Week 1-2: Alternate every OTHER day - 10 mg (whole tab) and then next day 5 mg (HALF tab) Week 3-4: 5 mg daily (HALF tablet) Week 5-6: 5 mg every OTHER day (HALF tablet) - alternating day is NO medicine (skip dose) STOP completely

## 2022-03-04 NOTE — Assessment & Plan Note (Addendum)
Dramatic weight loss w lifestyle and GIP/GLP1 therapy BMI < 35 now

## 2022-03-04 NOTE — Assessment & Plan Note (Addendum)
Excellent improved DM control on Mounjaro and lifestyle A1c 6.3 down to 5.0, very impressive and major weight loss Complications - other including hyperlipidemia, depression, increases risk of future cardiovascular complications and poor glucose control due to reduced lifestyle diet/exercise with low energy mood and fatigue OFF Ozempic  Plan:  1. Continue Mounjaro 12.'5mg'$  weekly inj - note she had to increase before due to out of stock lower dose. - Note plan to taper down on Mounjaro in future to maintenance dose - DISCONTINUE Metformin completely 2. Encourage improved lifestyle - may continue low carb, low sugar diet, reduce portion size, continue improving regular exercise 3. Check CBG, bring log to next visit for review 4. Continue ARB DM Foot check [facility] Summer

## 2022-03-04 NOTE — Patient Instructions (Addendum)
Thank you for coming to the office today.  Excellent work overall! Weight loss and A1c are amazing.  Recent Labs    08/27/21 0829 03/04/22 0852  HGBA1C 6.3* 5.0   Stop Metformin completely.  Medication Taper Off Instructions Current med - Escitalopram 10 mg  Week 1-2: Alternate every OTHER day - 10 mg (whole tab) and then next day 5 mg (HALF tab) Week 3-4: 5 mg daily (HALF tablet) Week 5-6: 5 mg every OTHER day (HALF tablet) - alternating day is NO medicine (skip dose) STOP completely  -----------------------------------------  Flu Shot today  DUE for FASTING BLOOD WORK (no food or drink after midnight before the lab appointment, only water or coffee without cream/sugar on the morning of)  SCHEDULE "Lab Only" visit in the morning at the clinic for lab draw in 6 MONTHS   - Make sure Lab Only appointment is at about 1 week before your next appointment, so that results will be available  For Lab Results, once available within 2-3 days of blood draw, you can can log in to MyChart online to view your results and a brief explanation. Also, we can discuss results at next follow-up visit.   Please schedule a Follow-up Appointment to: Return in about 6 months (around 09/03/2022) for 6 month fasting lab only then 1 week later Annual Physical.  If you have any other questions or concerns, please feel free to call the office or send a message through [place]. You may also schedule an earlier appointment if necessary.  Additionally, you may be receiving a survey about your experience at our office within a few days to 1 week by e-mail or mail. We value your feedback.  Nobie Putnam, DO [place]

## 2022-03-05 LAB — MICROALBUMIN / CREATININE URINE RATIO
Creatinine, Urine: 245 mg/dL (ref 20–275)
Microalb Creat Ratio: 4 mcg/mg creat (ref <30)
Microalb, Ur: 1.1 mg/dL

## 2022-04-11 ENCOUNTER — Encounter: Payer: Self-pay | Admitting: Family Medicine

## 2022-04-17 ENCOUNTER — Ambulatory Visit
Admission: EM | Admit: 2022-04-17 | Discharge: 2022-04-17 | Disposition: A | Discharge status: Home / Self Care | Payer: Managed Care, Other (non HMO) | Attending: Urgent Care | Admitting: Urgent Care

## 2022-04-17 DIAGNOSIS — N3001 Acute cystitis with hematuria: Secondary | ICD-10-CM

## 2022-04-17 LAB — POCT URINALYSIS DIP (MANUAL ENTRY)
Bilirubin, UA: NEGATIVE
Glucose, UA: NEGATIVE mg/dL
Ketones, POC UA: NEGATIVE mg/dL
Nitrite, UA: NEGATIVE
Protein Ur, POC: NEGATIVE mg/dL
Spec Grav, UA: 1.025 (ref 1.010–1.025)
Urobilinogen, UA: 0.2 E.U./dL
pH, UA: 7 (ref 5.0–8.0)

## 2022-04-17 MED ORDER — SULFAMETHOXAZOLE-TRIMETHOPRIM 800-160 MG PO TABS: 1.0000 | ORAL_TABLET | Freq: Two times a day (BID) | ORAL | 0 refills | Status: AC

## 2022-04-17 NOTE — ED Triage Notes (Signed)
Pt. Presents to UC w/ c/o dysuria, urinary frequency and urgency that started 2 days ago.

## 2022-04-17 NOTE — ED Provider Notes (Signed)
Roderic Palau    CSN: [id number] Arrival date & time: 04/17/22  1413      History   Chief Complaint Chief Complaint  Patient presents with   Dysuria   Urinary Frequency    HPI Ethelda Deangelo is a 41 y.o. female.    Dysuria Urinary Frequency    Accompanied by her husband.  She presents to urgent care with complaint of painful urination, frequency, urgency starting 2 days ago.  She endorses some suprapubic pain as well as bilateral flank pain.  She denies fever but endorses an overall feeling of fatigue.  Past Medical History:  Diagnosis Date   Pilonidal cyst     Patient Active Problem List   Diagnosis Date Noted   Generalized anxiety disorder with panic attacks 11/25/2020   Recurrent major depressive disorder, in full remission ([place]) 01/25/2020   Hyperlipidemia associated with type 2 diabetes mellitus ([place]) 02/09/2018   Obesity (BMI 30.0-34.9) 01/27/2017   Gout 03/10/2016   Type 2 diabetes mellitus with other specified complication ([place]) 47/01/6282    Past Surgical History:  Procedure Laterality Date   PILONIDAL CYST EXCISION  2005    OB History     Gravida  0   Para      Term      Preterm      AB      Living         SAB      IAB      Ectopic      Multiple      Live Births           Obstetric Comments  1st Menstrual Cycle:  14  1st Pregnancy:  0          Home Medications    Prior to Admission medications   Medication Sig Start Date End Date Taking? Authorizing Provider  albuterol (VENTOLIN HFA) 108 (90 Base) MCG/ACT inhaler Inhale 2 puffs into the lungs every 4 (four) hours as needed for wheezing or shortness of breath. 06/30/21   Karamalegos, Devonne Doughty, DO  Blood Glucose Monitoring Suppl (ONE TOUCH ULTRA 2) w/Device KIT OneTouch Ultra2 Meter kit  TK UTD BID    [provider]  busPIRone (BUSPAR) 5 MG tablet Take 1 tablet (5 mg total) by mouth 3 (three) times daily as needed (anxiety). 09/03/21    Karamalegos, Devonne Doughty, DO  escitalopram (LEXAPRO) 10 MG tablet Taper off medication as advised. Start with 59m daily, gradually reduce to half tab for 577mand then discontinue when ready. 03/04/22   Karamalegos, AlDevonne DoughtyDO  Insulin Pen Needle (NOVOFINE PLUS) 32G X 4 MM MISC Use with Ozempic weekly injection as instructed 12/05/17   Karamalegos, AlDevonne DoughtyDO  Loratadine (CLARITIN PO) Claritin    [provider]  losartan (COZAAR) 25 MG tablet TAKE 1/2 TABLET(12.5 MG) BY MOUTH DAILY 08/06/21   Karamalegos, AlDevonne DoughtyDO  meclizine (ANTIVERT) 25 MG tablet Take 1 tablet (25 mg total) by mouth 3 (three) times daily as needed for dizziness. 06/22/20   Karamalegos, AlDevonne DoughtyDO  montelukast (SINGULAIR) 10 MG tablet Take 1 tablet (10 mg total) by mouth at bedtime. 02/12/21   Karamalegos, AlDevonne DoughtyDO  MOUNJARO 12.5 MG/0.5ML Pen Inject 12.5 mg into the skin once a week. 02/15/22   Karamalegos, Alexander J, DO  ondansetron (ZOFRAN-ODT) 4 MG disintegrating tablet DISSOLVE 1 TABLET(4 MG) ON THE TONGUE EVERY 8 HOURS AS NEEDED FOR NAUSEA OR VOMITING 12/02/21   KaNobie Putnam  J, DO  ONETOUCH DELICA LANCETS 68E MISC U UTD BID 04/03/15   [provider]    Family History Family History  Problem Relation Age of Onset   Hypertension Mother    Depression Mother    Basal cell carcinoma Mother    Breast cancer Neg Hx    Colon cancer Neg Hx     Social History Social History   Tobacco Use   Smoking status: Former    Packs/day: 1.00    Years: 1.00    Total pack years: 1.00    Types: Cigarettes    Quit date: 05/10/1999    Years since quitting: 22.9   Smokeless tobacco: Former  Substance Use Topics   Alcohol use: Yes    Alcohol/week: 0.0 standard drinks of alcohol    Comment: on occasion   Drug use: No     Allergies   Augmentin [amoxicillin-pot clavulanate] and Codeine   Review of Systems Review of Systems  Genitourinary:  Positive for dysuria and  frequency.     Physical Exam Triage Vital Signs ED Triage Vitals  Enc Vitals Group     BP 04/17/22 1451 119/78     Pulse Rate 04/17/22 1451 (!) 111     Resp 04/17/22 1451 18     Temp 04/17/22 1451 97.9 F (36.6 C)     Temp src --      SpO2 04/17/22 1451 97 %     Weight --      Height --      Head Circumference --      Peak Flow --      Pain Score 04/17/22 1452 0     Pain Loc --      Pain Edu? --      Excl. in [place]? --    No data found.  Updated Vital Signs BP 119/78   Pulse (!) 111   Temp 97.9 F (36.6 C)   Resp 18   SpO2 97%   Visual Acuity Right Eye Distance:   Left Eye Distance:   Bilateral Distance:    Right Eye Near:   Left Eye Near:    Bilateral Near:     Physical Exam Vitals reviewed.  Constitutional:      Appearance: Normal appearance.  Skin:    General: Skin is warm and dry.  Neurological:     General: No focal deficit present.     Mental Status: She is alert and oriented to person, place, and time.  Psychiatric:        Mood and Affect: Mood normal.        Behavior: Behavior normal.      UC Treatments / Results  Labs (all labs ordered are listed, but only abnormal results are displayed) Labs Reviewed  POCT URINALYSIS DIP (MANUAL ENTRY) - Abnormal; Notable for the following components:      Result Value   Clarity, UA cloudy (*)    Blood, UA large (*)    Leukocytes, UA Large (3+) (*)    All other components within normal limits    EKG   Radiology No results found.  Procedures Procedures (including critical care time)  Medications Ordered in UC Medications - No data to display  Initial Impression / Assessment and Plan / UC Course  I have reviewed the triage vital signs and the nursing notes.  Pertinent labs & imaging results that were available during my care of the patient were reviewed by me and considered in my medical  decision making (see chart for details).   UA is positive for large leukocytes, large blood.  Urine is  cloudy.  Concern for complicated UTI given her back pain and feeling of malaise.  Will treat with Bactrim twice daily x 7 days.   Final Clinical Impressions(s) / UC Diagnoses   Final diagnoses:  None   Discharge Instructions   None    ED Prescriptions   None    PDMP not reviewed this encounter.   Rose Phi, [place] 04/17/22 1512

## 2022-04-17 NOTE — Discharge Instructions (Signed)
You were treated today for a urinary tract infection with concern for possible pyelonephritis.  You are given an antibiotic with a duration longer than normal as a result.  If your symptoms are not resolved by day 3, you may stop the antibiotic.  Otherwise I recommend continuing the antibiotic for the full duration.  If you have no side effects from the antibiotic, you may decide to continue the antibiotic to ensure full recovery.  Follow up here or with your primary care provider if your symptoms are worsening or not improving with treatment.

## 2022-04-25 ENCOUNTER — Encounter: Payer: Self-pay | Admitting: Family Medicine

## 2022-04-26 ENCOUNTER — Encounter: Payer: Self-pay | Admitting: Family Medicine

## 2022-04-26 ENCOUNTER — Ambulatory Visit (INDEPENDENT_AMBULATORY_CARE_PROVIDER_SITE_OTHER): Payer: 59 | Admitting: Family Medicine

## 2022-04-26 VITALS — BP 136/96 | HR 89 | Ht 65.0 in | Wt 191.0 lb

## 2022-04-26 DIAGNOSIS — N3001 Acute cystitis with hematuria: Secondary | ICD-10-CM | POA: Diagnosis not present

## 2022-04-26 DIAGNOSIS — R310 Gross hematuria: Secondary | ICD-10-CM

## 2022-04-26 DIAGNOSIS — B379 Candidiasis, unspecified: Secondary | ICD-10-CM

## 2022-04-26 DIAGNOSIS — T3695XA Adverse effect of unspecified systemic antibiotic, initial encounter: Secondary | ICD-10-CM

## 2022-04-26 LAB — POCT URINALYSIS DIPSTICK
Bilirubin, UA: NEGATIVE
Glucose, UA: NEGATIVE
Ketones, UA: NEGATIVE
Leukocytes, UA: NEGATIVE
Nitrite, UA: NEGATIVE
Protein, UA: NEGATIVE
Spec Grav, UA: 1.02 (ref 1.010–1.025)
Urobilinogen, UA: 0.2 E.U./dL
pH, UA: 6 (ref 5.0–8.0)

## 2022-04-26 MED ORDER — CEPHALEXIN 500 MG PO CAPS: 500.0000 mg | ORAL_CAPSULE | Freq: Three times a day (TID) | ORAL | 0 refills | Status: DC

## 2022-04-26 MED ORDER — FLUCONAZOLE 150 MG PO TABS: ORAL_TABLET | ORAL | 1 refills | Status: DC

## 2022-04-26 NOTE — Patient Instructions (Addendum)
Thank you for coming to the office today.  1. You have a Urinary Tract Infection - this is very common, your symptoms are reassuring and you should get better within 1 week on the antibiotics  Already completed Bactrim, and we will try ROUND 2  - Start Keflex '500mg'$  3 times daily for next 7 days, complete entire course, even if feeling better  - We sent urine for a culture, we will call you within next few days if we need to change antibiotics - Please drink plenty of fluids, improve hydration over next 1 week  If symptoms worsening, developing nausea / vomiting, worsening back pain, fevers / chills / sweats, then please return for re-evaluation sooner.  If you take AZO OTC - limit this to 2-3 days MAX to avoid affecting kidneys  D-Mannose is a natural supplement that can actually help bind to urinary bacteria and reduce their effectiveness it can help prevent UTI from forming, and may reduce some symptoms. It likely cannot cure an active UTI but it is worth a try and good to prevent them with. Try '500mg'$  twice a day at a full dose if you want, or check package instructions for more info   If bleeding does not stop and if other symptoms persist then we can refer you to Urology for 2nd opinion.  [facility] -1st floor [place],  [place]  [address] Phone: [phone]   Please schedule a Follow-up Appointment to: Return if symptoms worsen or fail to improve.  If you have any other questions or concerns, please feel free to call the office or send a message through [place]. You may also schedule an earlier appointment if necessary.  Additionally, you may be receiving a survey about your experience at our office within a few days to 1 week by e-mail or mail. We value your feedback.  Nobie Putnam, DO [place] [facility]

## 2022-04-26 NOTE — Progress Notes (Signed)
Subjective:    Patient ID: Diana Cunningham, female    DOB: Feb 10, 1981, 41 y.o.   MRN: [id number]  Mariyana Fulop is a 41 y.o. female presenting on 04/26/2022 for Hematuria and Urinary Urgency   HPI  UTI Hematuria  Reports onset symptoms 12/8 with urinary urgency frequency dysuria. No blood seen. Also had some back pain. Went to [facility] on 12/10, urine dipstick showed Large Blood and Large Leuks, cloudy. Treatment started at that time with Bactrim-DS TWICE A DAY x 7 days she finished full course.  No prior UTI in the past. Admits some back discomfort as well. She is doing more physical work than her normal job this week. Hard to say if it is related to urine.  She has seen the blood now with gross hematuria.      03/04/2022    8:32 AM 09/03/2021    1:42 PM 06/30/2021   11:40 AM  Depression screen PHQ 2/9  Decreased Interest 0 0 0  Down, Depressed, Hopeless 0 0 0  PHQ - 2 Score 0 0 0  Altered sleeping 0 0 2  Tired, decreased energy 1 0 2  Change in appetite 0 0 0  Feeling bad or failure about yourself  0 0 0  Trouble concentrating 0 0 0  Moving slowly or fidgety/restless 0 0 0  Suicidal thoughts 0 0 0  PHQ-9 Score 1 0 4  Difficult doing work/chores Not difficult at all Not difficult at all Not difficult at all    Social History   Tobacco Use   Smoking status: Former    Packs/day: 1.00    Years: 1.00    Total pack years: 1.00    Types: Cigarettes    Quit date: 05/10/1999    Years since quitting: 22.9   Smokeless tobacco: Former  Substance Use Topics   Alcohol use: Yes    Alcohol/week: 0.0 standard drinks of alcohol    Comment: on occasion   Drug use: No    Review of Systems Per HPI unless specifically indicated above     Objective:    BP (!) 136/96   Pulse 89   Ht '5\' 5"'$  (1.651 m)   Wt 191 lb (86.6 kg)   SpO2 99%   BMI 31.78 kg/m   Wt Readings from Last 3 Encounters:  04/26/22 191 lb (86.6 kg)  03/04/22 195 lb (88.5 kg)  09/03/21 225 lb 3.2  oz (102.2 kg)    Physical Exam Vitals and nursing note reviewed.  Constitutional:      General: She is not in acute distress.    Appearance: Normal appearance. She is well-developed. She is not diaphoretic.     Comments: Well-appearing, comfortable, cooperative  HENT:     Head: Normocephalic and atraumatic.  Eyes:     General:        Right eye: No discharge.        Left eye: No discharge.     Conjunctiva/sclera: Conjunctivae normal.  Cardiovascular:     Rate and Rhythm: Normal rate.  Pulmonary:     Effort: Pulmonary effort is normal.  Skin:    General: Skin is warm and dry.     Findings: No erythema or rash.  Neurological:     Mental Status: She is alert and oriented to person, place, and time.  Psychiatric:        Mood and Affect: Mood normal.        Behavior: Behavior normal.  Thought Content: Thought content normal.     Comments: Well groomed, good eye contact, normal speech and thoughts    Results for orders placed or performed in visit on 04/26/22  POCT Urinalysis Dipstick  Result Value Ref Range   Color, UA Yellow    Clarity, UA Cloudy    Glucose, UA Negative Negative   Bilirubin, UA Negative    Ketones, UA Negative    Spec Grav, UA 1.020 1.010 - 1.025   Blood, UA Moderate ++    pH, UA 6.0 5.0 - 8.0   Protein, UA Negative Negative   Urobilinogen, UA 0.2 0.2 or 1.0 E.U./dL   Nitrite, UA Negative    Leukocytes, UA Negative Negative   Appearance     Odor        Assessment & Plan:   Problem List Items Addressed This Visit   None Visit Diagnoses     Acute cystitis with hematuria    -  Primary   Relevant Medications   cephALEXin (KEFLEX) 500 MG capsule   Gross hematuria       Relevant Orders   POCT Urinalysis Dipstick (Completed)   Urine Culture   Antibiotic-induced yeast infection       Relevant Medications   cephALEXin (KEFLEX) 500 MG capsule   fluconazole (DIFLUCAN) 150 MG tablet       Clinically consistent UTI recently confirmed on  urinalysis, but no culture Gross hematuria issue w uncertain etiology other than from UTI - now has resolved Potential trigger with sexual intercourse Afebrile, tolerating oral intake, no nausea vomiting   Resolved gross hematuria  Already completed Bactrim course, with improvement but not resolution Check urinalysis and urine culture today Start Keflex '500mg'$  3 times daily for next 7 days, complete entire course, even if feeling better  Consider OTC AZO / D Mannose if need Return criteria given  If persistent gross hematuria or persistent recurrent UTI will recommend Urology consult     Orders Placed This Encounter  Procedures   Urine Culture   POCT Urinalysis Dipstick    Meds ordered this encounter  Medications   cephALEXin (KEFLEX) 500 MG capsule    Sig: Take 1 capsule (500 mg total) by mouth 3 (three) times daily. For 7 days    Dispense:  21 capsule    Refill:  0   fluconazole (DIFLUCAN) 150 MG tablet    Sig: Take one tablet by mouth on Day 1. Repeat dose 2nd tablet on Day 3.    Dispense:  2 tablet    Refill:  1      Follow up plan: Return if symptoms worsen or fail to improve.   Nobie Putnam, [facility] 04/26/2022, 4:00 PM

## 2022-04-28 LAB — URINE CULTURE
MICRO NUMBER:: 14334669
SPECIMEN QUALITY:: ADEQUATE

## 2022-06-10 ENCOUNTER — Encounter: Payer: Self-pay | Admitting: Family Medicine

## 2022-06-10 ENCOUNTER — Other Ambulatory Visit: Payer: Self-pay | Admitting: Family Medicine

## 2022-06-10 ENCOUNTER — Ambulatory Visit (INDEPENDENT_AMBULATORY_CARE_PROVIDER_SITE_OTHER): Payer: 59 | Admitting: Family Medicine

## 2022-06-10 VITALS — BP 138/87 | HR 88 | Ht 65.0 in | Wt 187.0 lb

## 2022-06-10 DIAGNOSIS — E1169 Type 2 diabetes mellitus with other specified complication: Secondary | ICD-10-CM

## 2022-06-10 DIAGNOSIS — R42 Dizziness and giddiness: Secondary | ICD-10-CM

## 2022-06-10 DIAGNOSIS — H8111 Benign paroxysmal vertigo, right ear: Secondary | ICD-10-CM

## 2022-06-10 MED ORDER — MECLIZINE HCL 25 MG PO TABS: 25.0000 mg | ORAL_TABLET | Freq: Three times a day (TID) | ORAL | 2 refills | Status: DC | PRN

## 2022-06-10 MED ORDER — MOUNJARO 10 MG/0.5ML ~~LOC~~ SOAJ: 10.0000 mg | SUBCUTANEOUS | 1 refills | Status: DC

## 2022-06-10 NOTE — Progress Notes (Signed)
Subjective:    Patient ID: Diana Cunningham, female    DOB: August 25, 1980, 42 y.o.   MRN: [id number]  Diana Cunningham is a 42 y.o. female presenting on 06/10/2022 for Dizziness   HPI  Vertigo / Dizziness  Background history since 2018, and had flare in the past 2022.  Reports initial onset early in December 2023 with flare up  She usually takes Meclizine and does home Epley Maneuver and initially would resolve vertigo spell, but now seems to last for 2-3 days.  She describes a "constant dizziness" and can have episodic more severe flares with vertigo room spinning, she says sometimes worse in AM will wake up and turn and have flare with room spinning.  She had acute episode last week when bent over to clean toilet and had everything tilted and went sideways and had vertigo flare.  Not affecting her driving or vision, but seems to not return to baseline.  History of anxiety, she has Buspar '5mg'$  THREE TIMES A DAY but does not take it regularly now.  Taking Losartan 12.'5mg'$  (half of '25mg'$ ) Not having issues with low BP.       06/10/2022    9:15 AM 04/26/2022    4:39 PM 03/04/2022    8:32 AM  Depression screen PHQ 2/9  Decreased Interest 0 0 0  Down, Depressed, Hopeless 0 0 0  PHQ - 2 Score 0 0 0  Altered sleeping 0 0 0  Tired, decreased energy 0 1 1  Change in appetite 0 0 0  Feeling bad or failure about yourself  0 0 0  Trouble concentrating 0 0 0  Moving slowly or fidgety/restless 0 0 0  Suicidal thoughts 0 0 0  PHQ-9 Score 0 1 1  Difficult doing work/chores Not difficult at all Not difficult at all Not difficult at all    Social History   Tobacco Use   Smoking status: Former    Packs/day: 1.00    Years: 1.00    Total pack years: 1.00    Types: Cigarettes    Quit date: 05/10/1999    Years since quitting: 23.1   Smokeless tobacco: Former  Substance Use Topics   Alcohol use: Yes    Alcohol/week: 0.0 standard drinks of alcohol    Comment: on occasion   Drug use: No     Review of Systems Per HPI unless specifically indicated above     Objective:    BP 138/87   Pulse 88   Ht '5\' 5"'$  (1.651 m)   Wt 187 lb (84.8 kg)   SpO2 99%   BMI 31.12 kg/m   Wt Readings from Last 3 Encounters:  06/10/22 187 lb (84.8 kg)  04/26/22 191 lb (86.6 kg)  03/04/22 195 lb (88.5 kg)    Physical Exam Vitals and nursing note reviewed.  Constitutional:      General: She is not in acute distress.    Appearance: Normal appearance. She is well-developed. She is not diaphoretic.     Comments: Well-appearing, comfortable, cooperative  HENT:     Head: Normocephalic and atraumatic.  Eyes:     General:        Right eye: No discharge.        Left eye: No discharge.     Conjunctiva/sclera: Conjunctivae normal.  Cardiovascular:     Rate and Rhythm: Normal rate.  Pulmonary:     Effort: Pulmonary effort is normal.  Skin:    General: Skin is warm and dry.  Findings: No erythema or rash.  Neurological:     Mental Status: She is alert and oriented to person, place, and time.  Psychiatric:        Mood and Affect: Mood normal.        Behavior: Behavior normal.        Thought Content: Thought content normal.     Comments: Well groomed, good eye contact, normal speech and thoughts       Results for orders placed or performed in visit on 04/26/22  Urine Culture   Specimen: Urine  Result Value Ref Range   MICRO NUMBER: [id number]    SPECIMEN QUALITY: Adequate    Sample Source URINE    STATUS: FINAL    ISOLATE 1: Coagulase negative staphylococcus, not S. (A)   POCT Urinalysis Dipstick  Result Value Ref Range   Color, UA Yellow    Clarity, UA Cloudy    Glucose, UA Negative Negative   Bilirubin, UA Negative    Ketones, UA Negative    Spec Grav, UA 1.020 1.010 - 1.025   Blood, UA Moderate ++    pH, UA 6.0 5.0 - 8.0   Protein, UA Negative Negative   Urobilinogen, UA 0.2 0.2 or 1.0 E.U./dL   Nitrite, UA Negative    Leukocytes, UA Negative Negative   Appearance      Odor        Assessment & Plan:   Problem List Items Addressed This Visit   None Visit Diagnoses     Dizziness    -  Primary   Relevant Orders   Ambulatory referral to ENT   Benign paroxysmal positional vertigo of right ear       Relevant Medications   meclizine (ANTIVERT) 25 MG tablet   Other Relevant Orders   Ambulatory referral to ENT       Likely underlying vestibular cause or inner ear triggering vertigo.  Note some fam history as well. Seems to be more chronic underlying condition  Considered med side effect, she is no longer taking Buspar regularly. Less likely cause. Seems unrelated to GLP therapy.  acute on chronic worsening vertigo, history since 2018 has had increasing flares since 2022, more recently with persistent baseline dizziness and acute more severe vertigo room spinning spells, has tried meclizine and [place], requesting further ENT evaluation diagnostic and management   Referral to ENT  Keep doing your home Epley Maneuever and Meclizine, re ordered meclizine today.  [facility] [address] [place], [place] [address] Phone: [phone]   Seems less likely neurological but we can consider Neurology consult in future after ENT if needed.   Orders Placed This Encounter  Procedures   Ambulatory referral to ENT    Referral Priority:   Routine    Referral Type:   Consultation    Referral Reason:   Specialty Services Required    Requested Specialty:   Otolaryngology    Number of Visits Requested:   1     Meds ordered this encounter  Medications   meclizine (ANTIVERT) 25 MG tablet    Sig: Take 1 tablet (25 mg total) by mouth 3 (three) times daily as needed for dizziness.    Dispense:  60 tablet    Refill:  2      Follow up plan: Return if symptoms worsen or fail to improve.   Nobie Putnam, DO [facility] 06/10/2022, 9:29 AM

## 2022-06-10 NOTE — Patient Instructions (Addendum)
Thank you for coming to the office today.  Likely underlying vestibular cause or inner ear triggering vertigo.  Referral to ENT for further testing and management  Keep doing your home Epley Maneuever and Meclizine, re ordered meclizine today.  [facility] [address] [place], [place] [address] Phone: [phone]   Seems less likely neurological but we can consider Neurology consult in future after ENT if needed.   Please schedule a Follow-up Appointment to: Return if symptoms worsen or fail to improve.  If you have any other questions or concerns, please feel free to call the office or send a message through [place]. You may also schedule an earlier appointment if necessary.  Additionally, you may be receiving a survey about your experience at our office within a few days to 1 week by e-mail or mail. We value your feedback.  Nobie Putnam, DO [place]

## 2022-08-29 ENCOUNTER — Ambulatory Visit
Admission: RE | Admit: 2022-08-29 | Discharge: 2022-08-29 | Disposition: A | Discharge status: Home / Self Care | Payer: Managed Care, Other (non HMO) | Source: Ambulatory Visit | Attending: Urgent Care | Admitting: Urgent Care

## 2022-08-29 VITALS — BP 128/87 | HR 83 | Temp 97.6°F | Resp 16

## 2022-08-29 DIAGNOSIS — R300 Dysuria: Secondary | ICD-10-CM | POA: Diagnosis present

## 2022-08-29 LAB — POCT URINALYSIS DIP (MANUAL ENTRY)
Bilirubin, UA: NEGATIVE
Blood, UA: NEGATIVE
Glucose, UA: NEGATIVE mg/dL
Ketones, POC UA: NEGATIVE mg/dL
Nitrite, UA: NEGATIVE
Protein Ur, POC: NEGATIVE mg/dL
Spec Grav, UA: 1.015 (ref 1.010–1.025)
Urobilinogen, UA: 0.2 E.U./dL
pH, UA: 6.5 (ref 5.0–8.0)

## 2022-08-29 MED ORDER — CEPHALEXIN 500 MG PO CAPS: 500.0000 mg | ORAL_CAPSULE | Freq: Four times a day (QID) | ORAL | 0 refills | Status: AC

## 2022-08-29 NOTE — Discharge Instructions (Addendum)
Follow up here or with your primary care provider if your symptoms are worsening or not improving.     

## 2022-08-29 NOTE — ED Provider Notes (Addendum)
Diana Cunningham    CSN: [id number] Arrival date & time: 08/29/22  1850      History   Chief Complaint Chief Complaint  Patient presents with   Urinary Frequency    Pretty sure I have a UTI. Had one last year and have the same symptoms. Cloudy urine. - Entered by patient    HPI Diana Cunningham is a 42 y.o. female.    Urinary Frequency  Presents to urgent care with complaint of UTI symptoms.  She reports urinary frequency and cloudy urine.  Patient with UTI on 04/17/2022 and treated with Bactrim.  Failed treatment and retreated with cephalexin after culture.    Past Medical History:  Diagnosis Date   Pilonidal cyst     Patient Active Problem List   Diagnosis Date Noted   Generalized anxiety disorder with panic attacks 11/25/2020   Recurrent major depressive disorder, in full remission 01/25/2020   Hyperlipidemia associated with type 2 diabetes mellitus 02/09/2018   Obesity (BMI 30.0-34.9) 01/27/2017   Gout 03/10/2016   Type 2 diabetes mellitus with other specified complication 02/20/2015    Past Surgical History:  Procedure Laterality Date   PILONIDAL CYST EXCISION  2005    OB History     Gravida  0   Para      Term      Preterm      AB      Living         SAB      IAB      Ectopic      Multiple      Live Births           Obstetric Comments  1st Menstrual Cycle:  14  1st Pregnancy:  1          Home Medications    Prior to Admission medications   Medication Sig Start Date End Date Taking? Authorizing Provider  albuterol (VENTOLIN HFA) 108 (90 Base) MCG/ACT inhaler Inhale 2 puffs into the lungs every 4 (four) hours as needed for wheezing or shortness of breath. 06/30/21   Karamalegos, Netta Neat, DO  Blood Glucose Monitoring Suppl (ONE TOUCH ULTRA 2) w/Device KIT OneTouch Ultra2 Meter kit  TK UTD BID    [provider]  busPIRone (BUSPAR) 5 MG tablet Take 1 tablet (5 mg total) by mouth 3 (three) times daily as  needed (anxiety). 09/03/21   Karamalegos, Netta Neat, DO  escitalopram (LEXAPRO) 10 MG tablet Taper off medication as advised. Start with  daily, gradually reduce to half tab for  and then discontinue when ready. 03/04/22   Karamalegos, Netta Neat, DO  fluconazole (DIFLUCAN) 150 MG tablet Take one tablet by mouth on Day 1. Repeat dose 2nd tablet on Day 3. 04/26/22   Althea Charon, Netta Neat, DO  Insulin Pen Needle (NOVOFINE PLUS) 32G X 4 MM MISC Use with Ozempic weekly injection as instructed 12/05/17   Karamalegos, Netta Neat, DO  Loratadine (CLARITIN PO) Claritin    [provider]  losartan (COZAAR) 25 MG tablet TAKE 1/2 TABLET(12.5 MG) BY MOUTH DAILY 08/06/21   Karamalegos, Netta Neat, DO  meclizine (ANTIVERT) 25 MG tablet Take 1 tablet (25 mg total) by mouth 3 (three) times daily as needed for dizziness. 06/10/22   Karamalegos, Netta Neat, DO  montelukast (SINGULAIR) 10 MG tablet Take 1 tablet (10 mg total) by mouth at bedtime. 02/12/21   Karamalegos, Netta Neat, DO  MOUNJARO 10 MG/0.5ML Pen Inject 10 mg into the skin once  a week. 06/10/22   Karamalegos, Netta Neat, DO  ondansetron (ZOFRAN-ODT) 4 MG disintegrating tablet DISSOLVE 1 TABLET(4 MG) ON THE TONGUE EVERY 8 HOURS AS NEEDED FOR NAUSEA OR VOMITING 12/02/21   Karamalegos, Netta Neat, DO  ONETOUCH DELICA LANCETS 33G MISC U UTD BID 04/03/15   [provider]    Family History Family History  Problem Relation Age of Onset   Hypertension Mother    Depression Mother    Basal cell carcinoma Mother    Breast cancer Neg Hx    Colon cancer Neg Hx     Social History Social History   Tobacco Use   Smoking status: Former    Packs/day: 1.00    Years: 1.00    Additional pack years: 0.00    Total pack years: 1.00    Types: Cigarettes    Quit date: 05/10/1999    Years since quitting: 23.3   Smokeless tobacco: Former  Substance Use Topics   Alcohol use: Yes    Alcohol/week: 0.0 standard drinks of alcohol     Comment: on occasion   Drug use: No     Allergies   Augmentin [amoxicillin-pot clavulanate] and Codeine   Review of Systems Review of Systems  Genitourinary:  Positive for frequency.     Physical Exam Triage Vital Signs ED Triage Vitals  Enc Vitals Group     BP      Pulse      Resp      Temp      Temp src      SpO2      Weight      Height      Head Circumference      Peak Flow      Pain Score      Pain Loc      Pain Edu?      Excl. in GC?    No data found.  Updated Vital Signs There were no vitals taken for this visit.  Visual Acuity Right Eye Distance:   Left Eye Distance:   Bilateral Distance:    Right Eye Near:   Left Eye Near:    Bilateral Near:     Physical Exam Vitals reviewed.  Constitutional:      Appearance: Normal appearance.  Skin:    General: Skin is dry.  Neurological:     General: No focal deficit present.     Mental Status: She is alert and oriented to person, place, and time.  Psychiatric:        Mood and Affect: Mood normal.        Behavior: Behavior normal.      UC Treatments / Results  Labs (all labs ordered are listed, but only abnormal results are displayed) Labs Reviewed  POCT URINALYSIS DIP (MANUAL ENTRY) - Abnormal; Notable for the following components:      Result Value   Leukocytes, UA Trace (*)    All other components within normal limits    EKG   Radiology No results found.  Procedures Procedures (including critical care time)  Medications Ordered in UC Medications - No data to display  Initial Impression / Assessment and Plan / UC Course  I have reviewed the triage vital signs and the nursing notes.  Pertinent labs & imaging results that were available during my care of the patient were reviewed by me and considered in my medical decision making (see chart for details).   UA result is not strongly indicative of urinary  tract infection.  Only trace leukocytes.  However, given her symptoms, will  treat empirically for UTI with cephalexin.  Counseled patient on potential for adverse effects with medications prescribed/recommended today, ER and return-to-clinic precautions discussed, patient verbalized understanding and agreement with care plan.   Final Clinical Impressions(s) / UC Diagnoses   Final diagnoses:  None   Discharge Instructions   None    ED Prescriptions   None    PDMP not reviewed this encounter.   Charma Igo, FNP 08/29/22 1906    Charma Igo, FNP 08/29/22 1919

## 2022-08-29 NOTE — ED Triage Notes (Signed)
Patient presents to UC for urinary freq and cloudy urine since 2 days. Not treating with OTC meds.

## 2022-08-30 LAB — URINE CULTURE: Culture: <10000 — AB

## 2022-09-09 ENCOUNTER — Encounter: Payer: Self-pay | Admitting: Family Medicine

## 2022-09-09 DIAGNOSIS — E1169 Type 2 diabetes mellitus with other specified complication: Secondary | ICD-10-CM

## 2022-09-09 MED ORDER — MOUNJARO 12.5 MG/0.5ML ~~LOC~~ SOAJ
12.5000 mg | SUBCUTANEOUS | 1 refills | Status: DC
Start: 2022-09-09 — End: 2022-11-28

## 2022-09-09 NOTE — Telephone Encounter (Signed)
Sent to me in error. 

## 2022-09-29 ENCOUNTER — Encounter: Payer: Self-pay | Admitting: Internal Medicine

## 2022-09-29 ENCOUNTER — Ambulatory Visit (INDEPENDENT_AMBULATORY_CARE_PROVIDER_SITE_OTHER): Payer: 59 | Admitting: Internal Medicine

## 2022-09-29 VITALS — BP 124/80 | HR 124 | Temp 97.1°F | Wt 184.0 lb

## 2022-09-29 DIAGNOSIS — E1169 Type 2 diabetes mellitus with other specified complication: Secondary | ICD-10-CM | POA: Diagnosis not present

## 2022-09-29 DIAGNOSIS — R1084 Generalized abdominal pain: Secondary | ICD-10-CM | POA: Diagnosis not present

## 2022-09-29 DIAGNOSIS — R110 Nausea: Secondary | ICD-10-CM

## 2022-09-29 DIAGNOSIS — R300 Dysuria: Secondary | ICD-10-CM | POA: Diagnosis not present

## 2022-09-29 DIAGNOSIS — R198 Other specified symptoms and signs involving the digestive system and abdomen: Secondary | ICD-10-CM

## 2022-09-29 DIAGNOSIS — R350 Frequency of micturition: Secondary | ICD-10-CM

## 2022-09-29 LAB — POCT URINALYSIS DIPSTICK
Bilirubin, UA: NEGATIVE
Blood, UA: NEGATIVE
Glucose, UA: NEGATIVE
Ketones, UA: NEGATIVE
Leukocytes, UA: NEGATIVE
Nitrite, UA: NEGATIVE
Protein, UA: NEGATIVE
Spec Grav, UA: >=1.03 — AB (ref 1.010–1.025)
Urobilinogen, UA: 0.2 E.U./dL
pH, UA: 5 (ref 5.0–8.0)

## 2022-09-29 MED ORDER — KETOROLAC TROMETHAMINE 30 MG/ML IJ SOLN
30.0000 mg | Freq: Once | INTRAMUSCULAR | Status: AC
Start: 2022-09-29 — End: 2022-09-29
  Administered 2022-09-29: 30 mg via INTRAMUSCULAR

## 2022-09-29 MED ORDER — DICYCLOMINE HCL 10 MG PO CAPS: 10.0000 mg | ORAL_CAPSULE | Freq: Three times a day (TID) | ORAL | 0 refills | Status: DC

## 2022-09-29 NOTE — Progress Notes (Signed)
Subjective:    Patient ID: Diana Cunningham, female    DOB: 12/25/1980, 42 y.o.   MRN: [id number]  HPI  Patient presents to clinic today with complaint of abdominal pain, constipation and diarrhea.  This started first occurred Monday night, improved to Tuesday Wednesday and reoccurred last night. She describes the pain as tight and crampy. She has had some nausea but denies vomiting or heartburn.  She reports she was initially constipated but before she could take MiraLAX, she started having diarrhea.  She has not noticed any blood in her stool.  She has had some urinary frequency and burning but denies urgency, dysuria, blood in her urine, vaginal discharge, vaginal odor, vaginal irritation or abnormal vaginal bleeding.  She did go to urgent care 4/22 with complaint of dysuria.  She reports her urinalysis was normal and she was not told that she had a UTI.  She was not treated with antibiotics at that time.  She has a history of DM2, managed on Mounjaro.  She takes her Mounjaro dose on Saturdays.  She has not had sick contacts that she is aware of.  She has not tried anything OTC for this.  Review of Systems     Past Medical History:  Diagnosis Date   Pilonidal cyst     Current Outpatient Medications  Medication Sig Dispense Refill   albuterol (VENTOLIN HFA) 108 (90 Base) MCG/ACT inhaler Inhale 2 puffs into the lungs every 4 (four) hours as needed for wheezing or shortness of breath. 8 g 2   Blood Glucose Monitoring Suppl (ONE TOUCH ULTRA 2) w/Device KIT OneTouch Ultra2 Meter kit  TK UTD BID     busPIRone (BUSPAR) 5 MG tablet Take 1 tablet (5 mg total) by mouth 3 (three) times daily as needed (anxiety). 90 tablet 3   escitalopram (LEXAPRO) 10 MG tablet Taper off medication as advised. Start with 10mg  daily, gradually reduce to half tab for 5mg  and then discontinue when ready. 90 tablet 0   fluconazole (DIFLUCAN) 150 MG tablet Take one tablet by mouth on Day 1. Repeat dose 2nd tablet on Day 3.  2 tablet 1   Insulin Pen Needle (NOVOFINE PLUS) 32G X 4 MM MISC Use with Ozempic weekly injection as instructed 30 each 3   Loratadine (CLARITIN PO) Claritin     losartan (COZAAR) 25 MG tablet TAKE 1/2 TABLET(12.5 MG) BY MOUTH DAILY 45 tablet 3   meclizine (ANTIVERT) 25 MG tablet Take 1 tablet (25 mg total) by mouth 3 (three) times daily as needed for dizziness. 60 tablet 2   montelukast (SINGULAIR) 10 MG tablet Take 1 tablet (10 mg total) by mouth at bedtime. 90 tablet 3   MOUNJARO 12.5 MG/0.5ML Pen Inject 12.5 mg into the skin once a week. 6 mL 1   ondansetron (ZOFRAN-ODT) 4 MG disintegrating tablet DISSOLVE 1 TABLET(4 MG) ON THE TONGUE EVERY 8 HOURS AS NEEDED FOR NAUSEA OR VOMITING 30 tablet 2   ONETOUCH DELICA LANCETS 33G MISC U UTD BID  0   No current facility-administered medications for this visit.    Allergies  Allergen Reactions   Augmentin [Amoxicillin-Pot Clavulanate]     Sick on stomach   Codeine     Family History  Problem Relation Age of Onset   Hypertension Mother    Depression Mother    Basal cell carcinoma Mother    Breast cancer Neg Hx    Colon cancer Neg Hx     Social History   Socioeconomic History  Marital status: Single    Spouse name: Not on file   Number of children: Not on file   Years of education: Not on file   Highest education level: Not on file  Occupational History   Not on file  Tobacco Use   Smoking status: Former    Packs/day: 1.00    Years: 1.00    Additional pack years: 0.00    Total pack years: 1.00    Types: Cigarettes    Quit date: 05/10/1999    Years since quitting: 23.4   Smokeless tobacco: Former  Substance and Sexual Activity   Alcohol use: Yes    Alcohol/week: 0.0 standard drinks of alcohol    Comment: on occasion   Drug use: No   Sexual activity: Yes  Other Topics Concern   Not on file  Social History Narrative   Not on file   Social Determinants of Health   Financial Resource Strain: Not on file  Food  Insecurity: Not on file  Transportation Needs: Not on file  Physical Activity: Not on file  Stress: Not on file  Social Connections: Not on file  Intimate Partner Violence: Not on file     Constitutional: Denies fever, malaise, fatigue, headache or abrupt weight changes.  Respiratory: Denies difficulty breathing, shortness of breath, cough or sputum production.   Cardiovascular: Denies chest pain, chest tightness, palpitations or swelling in the hands or feet.  Gastrointestinal: Patient reports nausea, abdominal pain, const patient and diarrhea.  Denies bloating, or blood in the stool.  GU: Patient reports urinary frequency and burning with urination.  Denies urgency, pain with urination, blood in urine, odor or discharge. Musculoskeletal: Denies decrease in range of motion, difficulty with gait, muscle pain or joint pain and swelling.  Skin: Denies redness, rashes, lesions or ulcercations.  Neurological: Denies dizziness, difficulty with memory, difficulty with speech or problems with balance and coordination.    No other specific complaints in a complete review of systems (except as listed in HPI above).  Objective:   Physical Exam   BP 124/80 (BP Location: Right Arm, Patient Position: Sitting, Cuff Size: Normal)   Pulse (!) 124   Temp (!) 97.1 F (36.2 C) (Temporal)   Wt 184 lb (83.5 kg)   SpO2 100%   BMI 30.62 kg/m   Wt Readings from Last 3 Encounters:  06/10/22 187 lb (84.8 kg)  04/26/22 191 lb (86.6 kg)  03/04/22 195 lb (88.5 kg)    General: Appears her stated age, obese in NAD. HEENT: Head: normal shape and size; Eyes: sclera white, no icterus, conjunctiva pink, PERRLA and EOMs intact;  Cardiovascular: Tachycardic with normal rhythm. S1,S2 noted.  No murmur, rubs or gallops noted.  Pulmonary/Chest: Normal effort and positive vesicular breath sounds. No respiratory distress. No wheezes, rales or ronchi noted.  Abdomen: Soft and generally tender.  Hypoactive bowel  sounds. No distention or masses noted. Liver, spleen and kidneys non palpable. Musculoskeletal:  No difficulty with gait.  Neurological: Alert and oriented.     BMET    Component Value Date/Time   NA 136 08/27/2021 0829   K 4.1 08/27/2021 0829   CL 100 08/27/2021 0829   CO2 24 08/27/2021 0829   GLUCOSE 119 (H) 08/27/2021 0829   BUN 10 08/27/2021 0829   CREATININE 0.65 08/27/2021 0829   CALCIUM 9.5 08/27/2021 0829   GFRNONAA 119 07/17/2020 0827   GFRAA 138 07/17/2020 0827    Lipid Panel     Component Value Date/Time  CHOL 176 08/27/2021 0829   TRIG 218 (H) 08/27/2021 0829   HDL 44 (L) 08/27/2021 0829   CHOLHDL 4.0 08/27/2021 0829   LDLCALC 98 08/27/2021 0829    CBC    Component Value Date/Time   WBC 8.9 08/27/2021 0829   RBC 4.80 08/27/2021 0829   HGB 13.7 08/27/2021 0829   HCT 42.2 08/27/2021 0829   PLT 316 08/27/2021 0829   MCV 87.9 08/27/2021 0829   MCH 28.5 08/27/2021 0829   MCHC 32.5 08/27/2021 0829   RDW 13.4 08/27/2021 0829   LYMPHSABS 2,456 08/27/2021 0829   MONOABS 658 02/16/2016 1025   EOSABS 418 08/27/2021 0829   BASOSABS 125 08/27/2021 0829    Hgb A1C Lab Results  Component Value Date   HGBA1C 5.0 03/04/2022           Assessment & Plan:   Generalized Abdominal Pain, Nausea, Constipation, Diarrhea:  DDx include medication side effect from [facility], viral GI illness, pancreatitis, cholecystitis We will check CBC, c-Met and lipase Toradol 30 mg IM x 1 Discussed ER precautions for worsening abdominal pain  Urinary Frequency, Burning with Urination:  Urgent care notes and labs were reviewed Repeat urinalysis normal, no indication for infection Push fluids  Follow-up with your PCP as previously scheduled Nicki Reaper, NP

## 2022-09-29 NOTE — Patient Instructions (Signed)
Abdominal Pain, Adult Many things can cause belly (abdominal) pain. Most times, belly pain is not dangerous. Many cases of belly pain can be watched and treated at home. Sometimes, though, belly pain is serious. Your doctor will try to find the cause of your belly pain. Follow these instructions at home:  Medicines Take over-the-counter and prescription medicines only as told by your doctor. Do not take medicines that help you poop (laxatives) unless told by your doctor. General instructions Watch your belly pain for any changes. Drink enough fluid to keep your pee (urine) pale yellow. Keep all follow-up visits as told by your doctor. This is important. Contact a doctor if: Your belly pain changes or gets worse. You are not hungry, or you lose weight without trying. You are having trouble pooping (constipated) or have watery poop (diarrhea) for more than 2-3 days. You have pain when you pee or poop. Your belly pain wakes you up at night. Your pain gets worse with meals, after eating, or with certain foods. You are vomiting and cannot keep anything down. You have a fever. You have blood in your pee. Get help right away if: Your pain does not go away as soon as your doctor says it should. You cannot stop vomiting. Your pain is only in areas of your belly, such as the right side or the left lower part of the belly. You have bloody or black poop, or poop that looks like tar. You have very bad pain, cramping, or bloating in your belly. You have signs of not having enough fluid or water in your body (dehydration), such as: Dark pee, very little pee, or no pee. Cracked lips. Dry mouth. Sunken eyes. Sleepiness. Weakness. You have trouble breathing or chest pain. Summary Many cases of belly pain can be watched and treated at home. Watch your belly pain for any changes. Take over-the-counter and prescription medicines only as told by your doctor. Contact a doctor if your belly pain  changes or gets worse. Get help right away if you have very bad pain, cramping, or bloating in your belly. This information is not intended to replace advice given to you by your health care provider. Make sure you discuss any questions you have with your health care provider. Document Revised: 09/03/2018 Document Reviewed: 09/03/2018 Elsevier Patient Education  2023 [employer]  

## 2022-10-03 LAB — CBC

## 2022-10-03 LAB — COMPLETE METABOLIC PANEL WITH GFR

## 2022-10-06 ENCOUNTER — Other Ambulatory Visit: Payer: 59

## 2022-10-06 DIAGNOSIS — Z0000 Encounter for general adult medical examination without abnormal findings: Secondary | ICD-10-CM

## 2022-10-06 DIAGNOSIS — E669 Obesity, unspecified: Secondary | ICD-10-CM

## 2022-10-06 DIAGNOSIS — E1169 Type 2 diabetes mellitus with other specified complication: Secondary | ICD-10-CM

## 2022-10-06 LAB — CBC WITH DIFFERENTIAL/PLATELET
Lymphs Abs: 2497 cells/uL (ref 850–3900)
Neutro Abs: 7718 cells/uL (ref 1500–7800)
Platelets: 368 10*3/uL (ref 140–400)
RBC: 4.53 10*6/uL (ref 3.80–5.10)
RDW: 12.6 % (ref 11.0–15.0)
Total Lymphocyte: 21.9 %
WBC: 11.4 10*3/uL — ABNORMAL HIGH (ref 3.8–10.8)

## 2022-10-07 ENCOUNTER — Other Ambulatory Visit: Payer: 59

## 2022-10-07 LAB — CBC WITH DIFFERENTIAL/PLATELET
Absolute Monocytes: 752 cells/uL (ref 200–950)
Basophils Absolute: 80 cells/uL (ref 0–200)
Basophils Relative: 0.7 %
Eosinophils Absolute: 353 cells/uL (ref 15–500)
Eosinophils Relative: 3.1 %
HCT: 40.3 % (ref 35.0–45.0)
Hemoglobin: 13.1 g/dL (ref 11.7–15.5)
MCH: 28.9 pg (ref 27.0–33.0)
MCHC: 32.5 g/dL (ref 32.0–36.0)
MCV: 89 fL (ref 80.0–100.0)
MPV: 10.3 fL (ref 7.5–12.5)
Monocytes Relative: 6.6 %
Neutrophils Relative %: 67.7 %

## 2022-10-07 LAB — LIPID PANEL
Cholesterol: 141 mg/dL (ref <200)
HDL: 47 mg/dL — ABNORMAL LOW (ref >=50)
LDL Cholesterol (Calc): 66 mg/dL (calc)
Non-HDL Cholesterol (Calc): 94 mg/dL (calc) (ref <130)
Total CHOL/HDL Ratio: 3 (calc) (ref <5.0)
Triglycerides: 227 mg/dL — ABNORMAL HIGH (ref <150)

## 2022-10-07 LAB — HEMOGLOBIN A1C
Hgb A1c MFr Bld: 5.4 % of total Hgb (ref <5.7)
Mean Plasma Glucose: 108 mg/dL
eAG (mmol/L): 6 mmol/L

## 2022-10-07 LAB — TSH: TSH: 1.75 mIU/L

## 2022-10-12 ENCOUNTER — Other Ambulatory Visit: Payer: Self-pay | Admitting: Neurology

## 2022-10-12 DIAGNOSIS — R519 Headache, unspecified: Secondary | ICD-10-CM

## 2022-10-14 ENCOUNTER — Ambulatory Visit (INDEPENDENT_AMBULATORY_CARE_PROVIDER_SITE_OTHER): Payer: 59 | Admitting: Family Medicine

## 2022-10-14 ENCOUNTER — Encounter: Payer: Self-pay | Admitting: Family Medicine

## 2022-10-14 VITALS — BP 119/72 | HR 105 | Ht 65.0 in | Wt 188.4 lb

## 2022-10-14 DIAGNOSIS — E669 Obesity, unspecified: Secondary | ICD-10-CM | POA: Diagnosis not present

## 2022-10-14 DIAGNOSIS — T753XXA Motion sickness, initial encounter: Secondary | ICD-10-CM

## 2022-10-14 DIAGNOSIS — J302 Other seasonal allergic rhinitis: Secondary | ICD-10-CM

## 2022-10-14 DIAGNOSIS — E1169 Type 2 diabetes mellitus with other specified complication: Secondary | ICD-10-CM

## 2022-10-14 DIAGNOSIS — F411 Generalized anxiety disorder: Secondary | ICD-10-CM

## 2022-10-14 DIAGNOSIS — Z0000 Encounter for general adult medical examination without abnormal findings: Secondary | ICD-10-CM

## 2022-10-14 DIAGNOSIS — F410 Panic disorder [episodic paroxysmal anxiety] without agoraphobia: Secondary | ICD-10-CM

## 2022-10-14 DIAGNOSIS — Z3169 Encounter for other general counseling and advice on procreation: Secondary | ICD-10-CM

## 2022-10-14 DIAGNOSIS — F3342 Major depressive disorder, recurrent, in full remission: Secondary | ICD-10-CM

## 2022-10-14 DIAGNOSIS — E785 Hyperlipidemia, unspecified: Secondary | ICD-10-CM

## 2022-10-14 DIAGNOSIS — Z124 Encounter for screening for malignant neoplasm of cervix: Secondary | ICD-10-CM

## 2022-10-14 MED ORDER — SCOPOLAMINE 1 MG/3DAYS TD PT72SCOPOLAMINE 1 MG/3DAYS
1.0000 | MEDICATED_PATCH | TRANSDERMAL | 1 refills | Status: DC
Start: 2022-10-14 — End: 2023-11-01

## 2022-10-14 MED ORDER — MONTELUKAST SODIUM 10 MG PO TABS
10.0000 mg | ORAL_TABLET | Freq: Every day | ORAL | 3 refills | Status: AC
Start: 2022-10-14 — End: ?

## 2022-10-14 MED ORDER — LOSARTAN POTASSIUM 25 MG PO TABS: ORAL_TABLET | ORAL | 3 refills | Status: DC

## 2022-10-14 MED ORDER — FLUTICASONE PROPIONATE 50 MCG/ACT NA SUSP
2.0000 | Freq: Every day | NASAL | 3 refills | Status: AC
Start: 2022-10-14 — End: ?

## 2022-10-14 NOTE — Assessment & Plan Note (Signed)
Stable in remission Anxiety comorbid  Remain off Escitalopram SSRI

## 2022-10-14 NOTE — Assessment & Plan Note (Signed)
Excellent improved DM control on Mounjaro and lifestyle A1c 5.4 Complications - other including hyperlipidemia, depression, increases risk of future cardiovascular complications and poor glucose control due to reduced lifestyle diet/exercise with low energy mood and fatigue OFF Ozempic, Off Metformin  Plan:  1. Continue Mounjaro 10-12.5mg  weekly inj. - Note plan to taper down on Mounjaro in future to maintenance dose 2. Encourage improved lifestyle - may continue low carb, low sugar diet, reduce portion size, continue improving regular exercise 3. Check CBG, bring log to next visit for review 4. Continue ARB DM Foot check [facility] in Summer

## 2022-10-14 NOTE — Assessment & Plan Note (Signed)
Controlled LDL, elevated TG Last lipid panel 09/2022 Calculated ASCVD 10 yr risk score low risk  Plan: 1. Reconsider statin therapy in future if indicated. Can take Omega 3 fish oil 2. Encourage improved lifestyle - low carb/cholesterol, reduce portion size, continue improving regular exercise

## 2022-10-14 NOTE — Patient Instructions (Addendum)
Thank you for coming to the office today.  Vitamin D Add up the units 2000-3000 okay for maintenance 5000+ is daily for boosting >30 for 12 weeks  Recent Labs    03/04/22 0852 10/06/22 1012  HGBA1C 5.0 5.4   Start nasal steroid Flonase 2 sprays in each nostril daily for 4-6 weeks, may repeat course seasonally or as needed  Singulair 10mg  daily  We will plan on referral to Shirlyn Goltz - if there is any change to this plan I will let you know!   Please schedule a Follow-up Appointment to: Return in about 6 months (around 04/15/2023) for 6 month follow-up DM A1c.  If you have any other questions or concerns, please feel free to call the office or send a message through MyChart. You may also schedule an earlier appointment if necessary.  Additionally, you may be receiving a survey about your experience at our office within a few days to 1 week by e-mail or mail. We value your feedback.  Saralyn Pilar, DO [facility], [place]

## 2022-10-14 NOTE — Assessment & Plan Note (Signed)
Improved On Buspar Off SSRI Escitalopram

## 2022-10-14 NOTE — Progress Notes (Signed)
Subjective:    Patient ID: Diana Cunningham, female    DOB: 26-Sep-1980, 42 y.o.   MRN: [id number]  Diana Cunningham is a 42 y.o. female presenting on 10/14/2022 for Annual Exam (Pt complains of runny nose, cough x 2 weeks. Pt currently treating symptoms with OTC medications with no Improvement. )   HPI  Here for Annual Physical and Lab Review  Allergic / Sinusitis Prior sick with GI symptoms, then allergies triggered Tried Mucinex Off Nasal spray  CHRONIC DM, Type 2 / Obesity BMI >32 w comorbidities Hyperlipidemia  A1c improved Significant improved sugars. Wt down to 180s Meds: Mounjaro 10-12.5mg  weekly Off Metformin Tolerating med well, no further nausea side effect Currently on ARB (Losartan half tab dose 12.5mg ) Lifestyle: - Diet (significantly improved diet and eating patterns, less craving sugar) - Exercise (increasing activity, no particular regimen currently) - ophthalmology exam, [facility] Summer 2023 - requested copy Foot Callus bilateral. Denies hypoglycemia, polyuria, visual changes, numbness or tingling   HYPERLIPIDEMIA: - Reports no concerns. Last lipid panel 2024, controlled LDL 66 with lifestyle and wt loss Not on statin Lifestyle - Diet: improved - Exercise: improving  Vestibular Migraines Followed by Neurology Improving diet and sleep Upcoming MRI + Sleep Study   Generalized Anxiety with Panic Attacks. DEPRESSION, Major - In Remission:  Off Escitalopram now doing well On Buspar infrequently Denies chest discomfort, dyspnea, sweating, headache, nausea vomiting  Health Maintenance: Due for pap smear     10/14/2022    9:10 AM 06/10/2022    9:15 AM 04/26/2022    4:39 PM  Depression screen PHQ 2/9  Decreased Interest 0 0 0  Down, Depressed, Hopeless 0 0 0  PHQ - 2 Score 0 0 0  Altered sleeping 0 0 0  Tired, decreased energy 0 0 1  Change in appetite 0 0 0  Feeling bad or failure about yourself  0 0 0  Trouble concentrating 0 0 0  Moving slowly  or fidgety/restless 0 0 0  Suicidal thoughts 0 0 0  PHQ-9 Score 0 0 1  Difficult doing work/chores Not difficult at all Not difficult at all Not difficult at all    Past Medical History:  Diagnosis Date   Pilonidal cyst    Past Surgical History:  Procedure Laterality Date   PILONIDAL CYST EXCISION  2005   Social History   Socioeconomic History   Marital status: Single    Spouse name: Not on file   Number of children: Not on file   Years of education: Not on file   Highest education level: Not on file  Occupational History   Not on file  Tobacco Use   Smoking status: Former    Packs/day: 1.00    Years: 1.00    Additional pack years: 0.00    Total pack years: 1.00    Types: Cigarettes    Quit date: 05/10/1999    Years since quitting: 23.4   Smokeless tobacco: Former  Substance and Sexual Activity   Alcohol use: Yes    Alcohol/week: 0.0 standard drinks of alcohol    Comment: on occasion   Drug use: No   Sexual activity: Yes  Other Topics Concern   Not on file  Social History Narrative   Not on file   Social Determinants of Health   Financial Resource Strain: Not on file  Food Insecurity: Not on file  Transportation Needs: Not on file  Physical Activity: Not on file  Stress: Not on file  Social  Connections: Not on file  Intimate Partner Violence: Not on file   Family History  Problem Relation Age of Onset   Hypertension Mother    Depression Mother    Basal cell carcinoma Mother    Breast cancer Neg Hx    Colon cancer Neg Hx    Current Outpatient Medications on File Prior to Visit  Medication Sig   Blood Glucose Monitoring Suppl (ONE TOUCH ULTRA 2) w/Device KIT OneTouch Ultra2 Meter kit  TK UTD BID   busPIRone (BUSPAR) 5 MG tablet Take 1 tablet (5 mg total) by mouth 3 (three) times daily as needed (anxiety).   dicyclomine (BENTYL) 10 MG capsule Take 1 capsule (10 mg total) by mouth 3 (three) times daily before meals.   Loratadine (CLARITIN PO) Claritin    meclizine (ANTIVERT) 25 MG tablet Take 1 tablet (25 mg total) by mouth 3 (three) times daily as needed for dizziness.   MOUNJARO 12.5 MG/0.5ML Pen Inject 12.5 mg into the skin once a week. (Patient taking differently: Inject 10 mg into the skin once a week.)   Multiple Vitamin (MULTI-VITAMIN) tablet Take 1 tablet by mouth daily.   ondansetron (ZOFRAN-ODT) 4 MG disintegrating tablet DISSOLVE 1 TABLET(4 MG) ON THE TONGUE EVERY 8 HOURS AS NEEDED FOR NAUSEA OR VOMITING   albuterol (VENTOLIN HFA) 108 (90 Base) MCG/ACT inhaler Inhale 2 puffs into the lungs every 4 (four) hours as needed for wheezing or shortness of breath. (Patient not taking: Reported on 10/14/2022)   ONETOUCH DELICA LANCETS 33G MISC U UTD BID (Patient not taking: Reported on 10/14/2022)   No current facility-administered medications on file prior to visit.    Review of Systems  Constitutional:  Negative for activity change, appetite change, chills, diaphoresis, fatigue and fever.  HENT:  Negative for congestion and hearing loss.   Eyes:  Negative for visual disturbance.  Respiratory:  Negative for cough, chest tightness, shortness of breath and wheezing.   Cardiovascular:  Negative for chest pain, palpitations and leg swelling.  Gastrointestinal:  Negative for abdominal pain, constipation, diarrhea, nausea and vomiting.  Genitourinary:  Negative for dysuria, frequency and hematuria.  Musculoskeletal:  Negative for arthralgias and neck pain.  Skin:  Negative for rash.  Neurological:  Negative for dizziness, weakness, light-headedness, numbness and headaches.  Hematological:  Negative for adenopathy.  Psychiatric/Behavioral:  Negative for behavioral problems, dysphoric mood and sleep disturbance.    Per HPI unless specifically indicated above      Objective:    BP 119/72 (BP Location: Right Arm, Patient Position: Sitting, Cuff Size: Large)   Pulse (!) 105   Ht 5\' 5"  (1.651 m)   Wt 188 lb 6.4 oz (85.5 kg)   SpO2 96%   BMI  31.35 kg/m   Wt Readings from Last 3 Encounters:  10/14/22 188 lb 6.4 oz (85.5 kg)  09/29/22 184 lb (83.5 kg)  06/10/22 187 lb (84.8 kg)    Physical Exam Vitals and nursing note reviewed.  Constitutional:      General: She is not in acute distress.    Appearance: She is well-developed. She is obese. She is not diaphoretic.     Comments: Well-appearing, comfortable, cooperative  HENT:     Head: Normocephalic and atraumatic.  Eyes:     General:        Right eye: No discharge.        Left eye: No discharge.     Conjunctiva/sclera: Conjunctivae normal.     Pupils: Pupils are equal, round, and  reactive to light.  Neck:     Thyroid: No thyromegaly.     Vascular: No carotid bruit.  Cardiovascular:     Rate and Rhythm: Normal rate and regular rhythm.     Pulses: Normal pulses.     Heart sounds: Normal heart sounds. No murmur heard. Pulmonary:     Effort: Pulmonary effort is normal. No respiratory distress.     Breath sounds: Normal breath sounds. No wheezing or rales.  Abdominal:     General: Bowel sounds are normal. There is no distension.     Palpations: Abdomen is soft. There is no mass.     Tenderness: There is no abdominal tenderness.  Musculoskeletal:        General: No tenderness. Normal range of motion.     Cervical back: Normal range of motion and neck supple.     Right lower leg: No edema.     Left lower leg: No edema.     Comments: Upper / Lower Extremities: - Normal muscle tone, strength bilateral upper extremities 5/5, lower extremities 5/5  Lymphadenopathy:     Cervical: No cervical adenopathy.  Skin:    General: Skin is warm and dry.     Findings: No erythema or rash.  Neurological:     Mental Status: She is alert and oriented to person, place, and time.     Comments: Distal sensation intact to light touch all extremities  Psychiatric:        Mood and Affect: Mood normal.        Behavior: Behavior normal.        Thought Content: Thought content normal.      Comments: Well groomed, good eye contact, normal speech and thoughts     Diabetic Foot Exam - Simple   Simple Foot Form Diabetic Foot exam was performed with the following findings: Yes 10/14/2022  9:31 AM  Visual Inspection No deformities, no ulcerations, no other skin breakdown bilaterally: Yes Sensation Testing Intact to touch and monofilament testing bilaterally: Yes Pulse Check Posterior Tibialis and Dorsalis pulse intact bilaterally: Yes Comments      Results for orders placed or performed in visit on 10/06/22  TSH  Result Value Ref Range   TSH 1.75 mIU/L  Hemoglobin A1c  Result Value Ref Range   Hgb A1c MFr Bld 5.4 <5.7 % of total Hgb   Mean Plasma Glucose 108 mg/dL   eAG (mmol/L) 6.0 mmol/L  Lipid panel  Result Value Ref Range   Cholesterol 141 <200 mg/dL   HDL 47 (L) > OR = 50 mg/dL   Triglycerides 960 (H) <150 mg/dL   LDL Cholesterol (Calc) 66 mg/dL (calc)   Total CHOL/HDL Ratio 3.0 <5.0 (calc)   Non-HDL Cholesterol (Calc) 94 <454 mg/dL (calc)  CBC with Differential/Platelet  Result Value Ref Range   WBC 11.4 (H) 3.8 - 10.8 Thousand/uL   RBC 4.53 3.80 - 5.10 Million/uL   Hemoglobin 13.1 11.7 - 15.5 g/dL   HCT 09.8 11.9 - 14.7 %   MCV 89.0 80.0 - 100.0 fL   MCH 28.9 27.0 - 33.0 pg   MCHC 32.5 32.0 - 36.0 g/dL   RDW 82.9 56.2 - 13.0 %   Platelets 368 140 - 400 Thousand/uL   MPV 10.3 7.5 - 12.5 fL   Neutro Abs 7,718 1,500 - 7,800 cells/uL   Lymphs Abs 2,497 850 - 3,900 cells/uL   Absolute Monocytes 752 200 - 950 cells/uL   Eosinophils Absolute 353 15 - 500 cells/uL  Basophils Absolute 80 0 - 200 cells/uL   Neutrophils Relative % 67.7 %   Total Lymphocyte 21.9 %   Monocytes Relative 6.6 %   Eosinophils Relative 3.1 %   Basophils Relative 0.7 %      Assessment & Plan:   Problem List Items Addressed This Visit     Generalized anxiety disorder with panic attacks    Improved On Buspar Off SSRI Escitalopram      Hyperlipidemia associated with type  2 diabetes mellitus (HCC)    Controlled LDL, elevated TG Last lipid panel 09/2022 Calculated ASCVD 10 yr risk score low risk  Plan: 1. Reconsider statin therapy in future if indicated. Can take Omega 3 fish oil 2. Encourage improved lifestyle - low carb/cholesterol, reduce portion size, continue improving regular exercise      Relevant Medications   losartan (COZAAR) 25 MG tablet   Obesity (BMI 30.0-34.9)   Recurrent major depressive disorder, in full remission (HCC)    Stable in remission Anxiety comorbid  Remain off Escitalopram SSRI      Type 2 diabetes mellitus with other specified complication (HCC)    Excellent improved DM control on Mounjaro and lifestyle A1c 5.4 Complications - other including hyperlipidemia, depression, increases risk of future cardiovascular complications and poor glucose control due to reduced lifestyle diet/exercise with low energy mood and fatigue OFF Ozempic, Off Metformin  Plan:  1. Continue Mounjaro 10-12.5mg  weekly inj. - Note plan to taper down on Mounjaro in future to maintenance dose 2. Encourage improved lifestyle - may continue low carb, low sugar diet, reduce portion size, continue improving regular exercise 3. Check CBG, bring log to next visit for review 4. Continue ARB DM Foot check [facility] in Summer      Relevant Medications   losartan (COZAAR) 25 MG tablet   Other Visit Diagnoses     Annual physical exam    -  Primary   Motion sickness, initial encounter       Relevant Medications   scopolamine (TRANSDERM-SCOP) 1 MG/3DAYS   Seasonal allergies       Relevant Medications   montelukast (SINGULAIR) 10 MG tablet   fluticasone (FLONASE) 50 MCG/ACT nasal spray   Pre-conception counseling       Relevant Orders   Ambulatory referral to Obstetrics / Gynecology   Cervical cancer screening       Relevant Orders   Ambulatory referral to Obstetrics / Gynecology      Updated Health Maintenance information Reviewed recent  lab results with patient Encouraged improvement to lifestyle with diet and exercise Goal of weight loss   Continue Vitamin D Check units  Add up the units 2000-3000 okay for maintenance 5000+ is daily for boosting >30 for 12 weeks  Sinusitis / Allergic Rhinitis Re order - Start nasal steroid Flonase 2 sprays in each nostril daily for 4-6 weeks, may repeat course seasonally or as needed Re order Singulair 10mg  daily  Referral to Shirlyn Goltz - She is pre conception planning for upcoming potential pregnancy. She will be interested to pursue establish with OBGYN who manages advanced maternal age pregnancy. Also due for Pap smear  Orders Placed This Encounter  Procedures   Ambulatory referral to Obstetrics / Gynecology    Referral Priority:   Routine    Referral Type:   Consultation    Referral Reason:   Specialty Services Required    Requested Specialty:   Obstetrics and Gynecology    Number of Visits Requested:   1  Meds ordered this encounter  Medications   scopolamine (TRANSDERM-SCOP) 1 MG/3DAYS    Sig: Place 1 patch (1.5 mg total) onto the skin every 3 (three) days. For motion sickness. Start 2 hr before onset symptoms, up to 12 hr before    Dispense:  4 patch    Refill:  1   montelukast (SINGULAIR) 10 MG tablet    Sig: Take 1 tablet (10 mg total) by mouth at bedtime.    Dispense:  90 tablet    Refill:  3   fluticasone (FLONASE) 50 MCG/ACT nasal spray    Sig: Place 2 sprays into both nostrils daily. Use for 4-6 weeks then stop and use seasonally or as needed.    Dispense:  16 g    Refill:  3   losartan (COZAAR) 25 MG tablet    Sig: TAKE 1/2 TABLET(12.5 MG) BY MOUTH DAILY    Dispense:  45 tablet    Refill:  3      Follow up plan: Return in about 6 months (around 04/15/2023) for 6 month follow-up DM A1c.  Saralyn Pilar, DO [facility] 10/14/2022, 9:11 AM

## 2022-10-15 ENCOUNTER — Ambulatory Visit
Admission: RE | Admit: 2022-10-15 | Discharge: 2022-10-15 | Disposition: A | Discharge status: Home / Self Care | Payer: Managed Care, Other (non HMO) | Source: Ambulatory Visit | Attending: Neurology | Admitting: Neurology

## 2022-10-15 DIAGNOSIS — R519 Headache, unspecified: Secondary | ICD-10-CM

## 2022-11-28 ENCOUNTER — Encounter: Payer: Self-pay | Admitting: Family Medicine

## 2022-11-28 MED ORDER — TIRZEPATIDE 10 MG/0.5ML ~~LOC~~ SOAJ: 10.0000 mg | SUBCUTANEOUS | 0 refills | Status: DC

## 2022-11-28 NOTE — Addendum Note (Signed)
Addended by: Judd Gaudier on: 11/28/2022 01:39 PM   Modules accepted: Orders

## 2022-12-06 ENCOUNTER — Encounter: Payer: Self-pay | Admitting: Family Medicine

## 2022-12-06 DIAGNOSIS — E1169 Type 2 diabetes mellitus with other specified complication: Secondary | ICD-10-CM

## 2022-12-06 MED ORDER — MOUNJARO 7.5 MG/0.5ML ~~LOC~~ SOAJ
7.5000 mg | SUBCUTANEOUS | 1 refills | Status: DC
Start: 2022-12-06 — End: 2023-04-17

## 2022-12-28 ENCOUNTER — Other Ambulatory Visit: Payer: Self-pay | Admitting: Internal Medicine

## 2022-12-28 DIAGNOSIS — I2510 Atherosclerotic heart disease of native coronary artery without angina pectoris: Secondary | ICD-10-CM

## 2022-12-28 DIAGNOSIS — I4891 Unspecified atrial fibrillation: Secondary | ICD-10-CM

## 2022-12-30 ENCOUNTER — Ambulatory Visit
Admission: RE | Admit: 2022-12-30 | Discharge: 2022-12-30 | Disposition: A | Discharge status: Home / Self Care | Payer: Managed Care, Other (non HMO) | Source: Ambulatory Visit | Attending: Internal Medicine | Admitting: Internal Medicine

## 2022-12-30 DIAGNOSIS — I4891 Unspecified atrial fibrillation: Secondary | ICD-10-CM | POA: Insufficient documentation

## 2022-12-30 DIAGNOSIS — I2510 Atherosclerotic heart disease of native coronary artery without angina pectoris: Secondary | ICD-10-CM | POA: Insufficient documentation

## 2023-04-15 IMAGING — CR DG CHEST 2V
1 series · 2 of 2 positions shown · non-contrast
Comparison: Most recent radiograph 08/05/2013, no interval exams
available.

CLINICAL DATA: Recurrent bronchospasm. Community acquired pneumonia
of right upper lobe of lung. [DATE] right upper lobe community
acquired pneumonia with persistent cough.

EXAM:
CHEST - 2 VIEW

[Series 1: dg chest 2 view · 0.14mm/px · 2 of 2 slices shown]
[im 1/2]
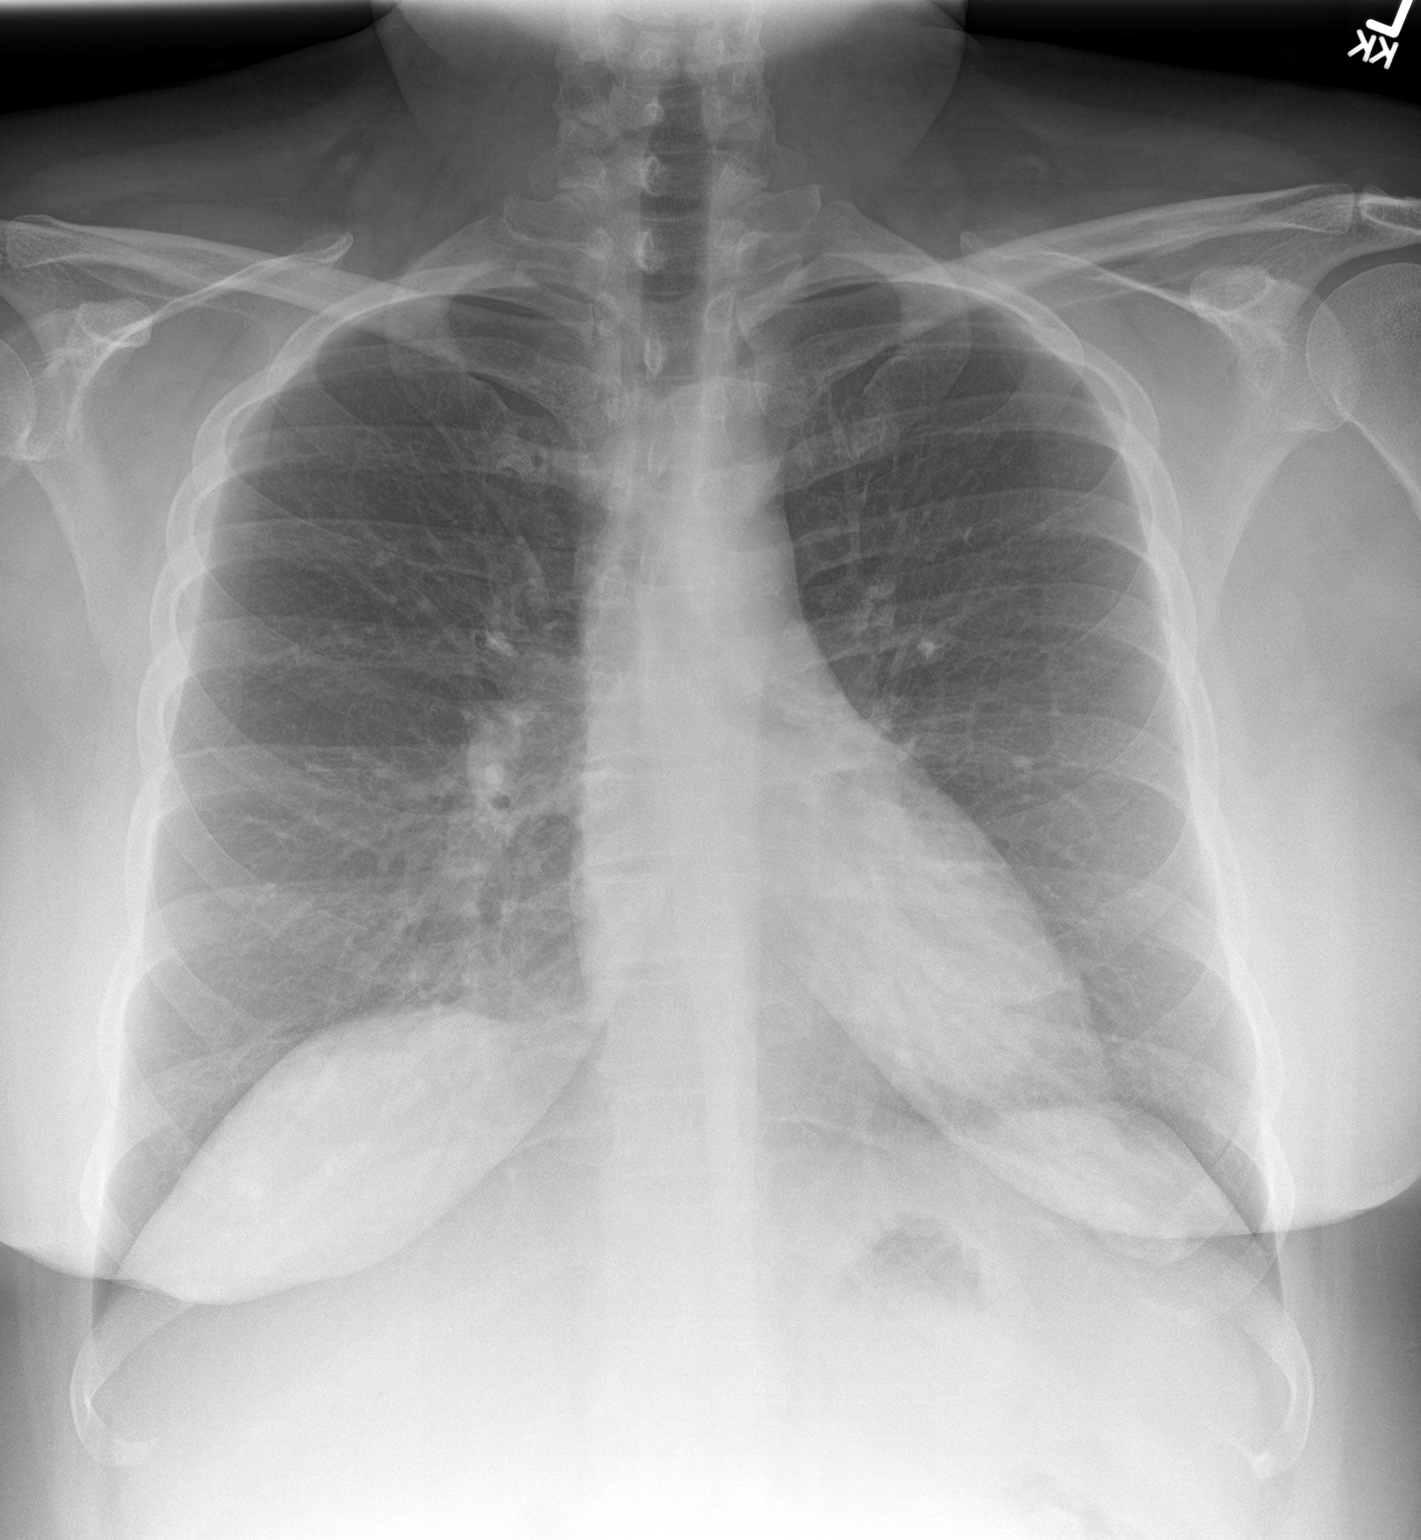
[im 2/2]
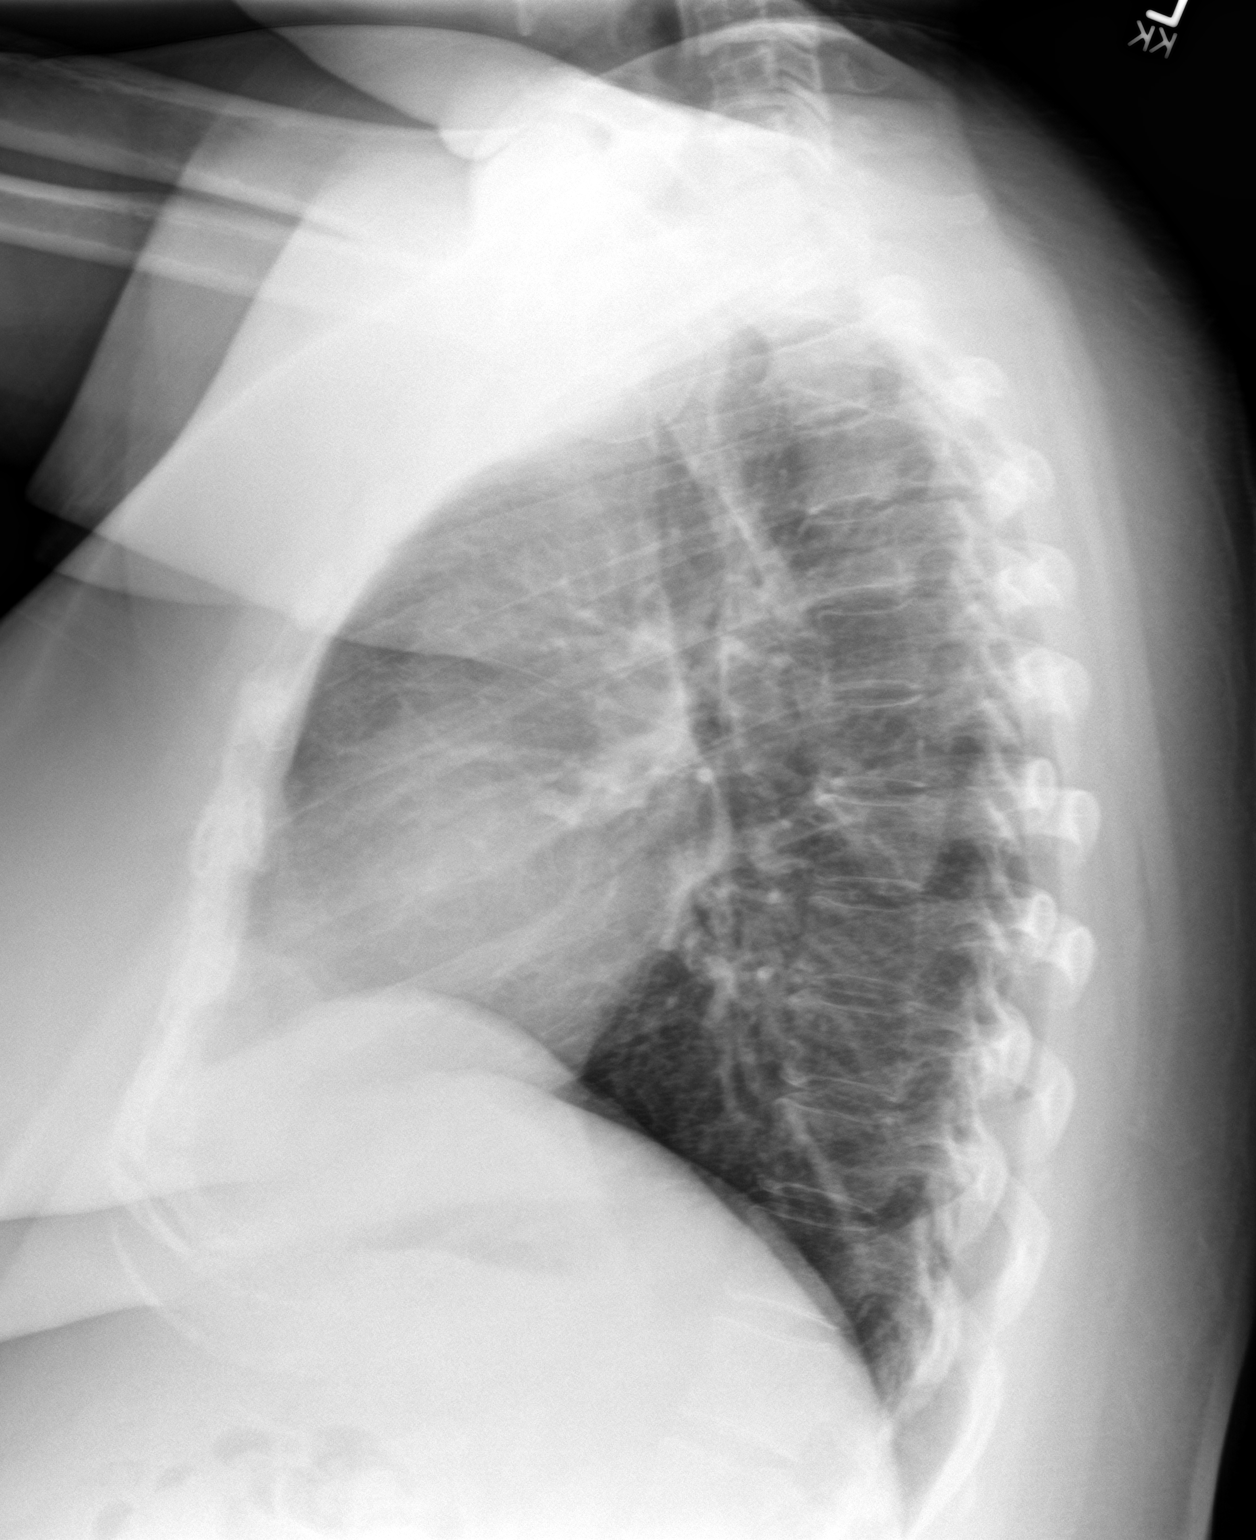

[2 of 2 positions shown; findings below may reference images not displayed]

FINDINGS: There is no focal airspace disease, particularly, no consolidation
in the right upper lobe. The heart is normal in size with normal
mediastinal contours. No pulmonary edema, pleural effusion, or
pneumothorax. No visualized nodule or pulmonary mass. No acute
osseous findings.
IMPRESSION: No evidence of pneumonia or focal airspace disease. No explanation
for cough.

## 2023-04-17 ENCOUNTER — Encounter: Payer: Self-pay | Admitting: Family Medicine

## 2023-04-17 ENCOUNTER — Other Ambulatory Visit: Payer: Self-pay | Admitting: Family Medicine

## 2023-04-17 ENCOUNTER — Ambulatory Visit: Payer: 59 | Admitting: Family Medicine

## 2023-04-17 VITALS — BP 138/88 | Ht 65.0 in | Wt 202.0 lb

## 2023-04-17 DIAGNOSIS — Z23 Encounter for immunization: Secondary | ICD-10-CM | POA: Diagnosis not present

## 2023-04-17 DIAGNOSIS — J302 Other seasonal allergic rhinitis: Secondary | ICD-10-CM | POA: Diagnosis not present

## 2023-04-17 DIAGNOSIS — J3089 Other allergic rhinitis: Secondary | ICD-10-CM | POA: Diagnosis not present

## 2023-04-17 DIAGNOSIS — E1169 Type 2 diabetes mellitus with other specified complication: Secondary | ICD-10-CM

## 2023-04-17 DIAGNOSIS — E66811 Obesity, class 1: Secondary | ICD-10-CM

## 2023-04-17 DIAGNOSIS — Z0000 Encounter for general adult medical examination without abnormal findings: Secondary | ICD-10-CM

## 2023-04-17 DIAGNOSIS — J0110 Acute frontal sinusitis, unspecified: Secondary | ICD-10-CM

## 2023-04-17 LAB — POCT GLYCOSYLATED HEMOGLOBIN (HGB A1C): Hemoglobin A1C: 5.5 % (ref 4.0–5.6)

## 2023-04-17 MED ORDER — MOUNJARO 10 MG/0.5ML ~~LOC~~ SOAJ
10.0000 mg | SUBCUTANEOUS | 1 refills | Status: DC
Start: 2023-04-17 — End: 2023-10-05

## 2023-04-17 MED ORDER — AMOXICILLIN 500 MG PO CAPS: 500.0000 mg | ORAL_CAPSULE | Freq: Two times a day (BID) | ORAL | 0 refills | Status: DC

## 2023-04-17 MED ORDER — AZELASTINE HCL 0.1 % NA SOLN
1.0000 | Freq: Two times a day (BID) | NASAL | 5 refills | Status: DC
Start: 2023-04-17 — End: 2023-11-01

## 2023-04-17 NOTE — Patient Instructions (Addendum)
Thank you for coming to the office today.  Recent Labs    10/06/22 1012 04/17/23 1338  HGBA1C 5.4 5.5   Try the Azelastine nasal spray for anti histamine allergy. 1-2 sprays twice a day. Can stop or reduce the other medications.  Try Simply Saline Nasal mist if interested. OTC  Dose inc Mounjaro from 7.5 up to 10mg   Sinuses, we will cover with Amoxicillin for now.   DUE for FASTING BLOOD WORK (no food or drink after midnight before the lab appointment, only water or coffee without cream/sugar on the morning of)  SCHEDULE "Lab Only" visit in the morning at the clinic for lab draw in  6 MONTHS   - Make sure Lab Only appointment is at about 1 week before your next appointment, so that results will be available  For Lab Results, once available within 2-3 days of blood draw, you can can log in to MyChart online to view your results and a brief explanation. Also, we can discuss results at next follow-up visit.   Please schedule a Follow-up Appointment to: Return in about 6 months (around 10/16/2023) for 6 month fasting lab > 1 week later Annual Physical.  If you have any other questions or concerns, please feel free to call the office or send a message through MyChart. You may also schedule an earlier appointment if necessary.  Additionally, you may be receiving a survey about your experience at our office within a few days to 1 week by e-mail or mail. We value your feedback.  Saralyn Pilar, DO [facility], [place]

## 2023-04-17 NOTE — Progress Notes (Signed)
Subjective:    Patient ID: Diana Cunningham, female    DOB: 03/20/1981, 42 y.o.   MRN: [id number]  Diana Cunningham is a 42 y.o. female presenting on 04/17/2023 for Medical Management of Chronic Issues (Sinus problems and Mounjaro)   HPI  Discussed the use of AI scribe software for clinical note transcription with the patient, who gave verbal consent to proceed.  History of Present Illness         CHRONIC DM, Type 2 / Obesity BMI >32 w comorbidities Hyperlipidemia  A1c improved Significant improved sugars. increased nausea, vomiting on 7.5 and 12.5, but did better on 10mg , request switch Mounjaro 7.5 to 10mg  Wt up from prior 180s now to 200+ Meds: Mounjaro 7.5mg  weekly Off Metformin Tolerating med well, no further nausea side effect Currently on ARB (Losartan half tab dose 12.5mg ) Lifestyle: - Diet (significantly improved diet and eating patterns, less craving sugar) - Exercise (increasing activity, no particular regimen currently) - ophthalmology exam, [facility] Summer 2023 - requested copy Foot Callus bilateral. Denies hypoglycemia, polyuria, visual changes, numbness or tingling  She will scheduled w/ [facility] for DM Eye exam soon   Followed with PT for Migraines / Vestibular Dizzy + Headaches Recently started She has seen Neurologist + Cardiologist  Sinusitis Reports duration 1 month Tried OTC med Sudafed, Flonase, also on Singulair, and tried DayQuil / NyQuil She has to clear congestion in the AM and dried thicker.  Neurology is adjusting her Vitamin D, was lower, now on medicine  Health Maintenance: Flu Vaccine     04/17/2023    1:37 PM 10/14/2022    9:10 AM 06/10/2022    9:15 AM  Depression screen PHQ 2/9  Decreased Interest 0 0 0  Down, Depressed, Hopeless 0 0 0  PHQ - 2 Score 0 0 0  Altered sleeping 1 0 0  Tired, decreased energy 1 0 0  Change in appetite 0 0 0  Feeling bad or failure about yourself  0 0 0  Trouble concentrating 0 0 0  Moving  slowly or fidgety/restless 0 0 0  Suicidal thoughts 0 0 0  PHQ-9 Score 2 0 0  Difficult doing work/chores  Not difficult at all Not difficult at all       04/17/2023    1:37 PM 10/14/2022    9:11 AM 06/10/2022    9:16 AM 04/26/2022    4:39 PM  GAD 7 : Generalized Anxiety Score  Nervous, Anxious, on Edge 0 0 1 0  Control/stop worrying 0 0 0 0  Worry too much - different things 0 0 0 0  Trouble relaxing 0 0 0 0  Restless 0 0 0 0  Easily annoyed or irritable 0 0 0 0  Afraid - awful might happen 0 0 0 0  Total GAD 7 Score 0 0 1 0  Anxiety Difficulty  Not difficult at all Not difficult at all Not difficult at all    Social History   Tobacco Use  . Smoking status: Former    Current packs/day: 0.00    Average packs/day: 1 pack/day for 1 year (1.0 ttl pk-yrs)    Types: Cigarettes    Start date: 05/09/1998    Quit date: 05/10/1999    Years since quitting: 23.9  . Smokeless tobacco: Former  Substance Use Topics  . Alcohol use: Yes    Alcohol/week: 0.0 standard drinks of alcohol    Comment: on occasion  . Drug use: No    Review of  Systems Per HPI unless specifically indicated above     Objective:    BP 138/88   Ht 5\' 5"  (1.651 m)   Wt 202 lb (91.6 kg)   BMI 33.61 kg/m   Wt Readings from Last 3 Encounters:  04/17/23 202 lb (91.6 kg)  10/14/22 188 lb 6.4 oz (85.5 kg)  09/29/22 184 lb (83.5 kg)    Physical Exam Vitals and nursing note reviewed.  Constitutional:      General: She is not in acute distress.    Appearance: Normal appearance. She is well-developed. She is obese. She is not diaphoretic.     Comments: Well-appearing, comfortable, cooperative  HENT:     Head: Normocephalic and atraumatic.  Eyes:     General:        Right eye: No discharge.        Left eye: No discharge.     Conjunctiva/sclera: Conjunctivae normal.  Cardiovascular:     Rate and Rhythm: Normal rate.  Pulmonary:     Effort: Pulmonary effort is normal.  Skin:    General: Skin is warm and dry.      Findings: No erythema or rash.  Neurological:     Mental Status: She is alert and oriented to person, place, and time.  Psychiatric:        Mood and Affect: Mood normal.        Behavior: Behavior normal.        Thought Content: Thought content normal.     Comments: Well groomed, good eye contact, normal speech and thoughts    Recent Labs    10/06/22 1012 04/17/23 1338  HGBA1C 5.4 5.5    Results for orders placed or performed in visit on 04/17/23  POCT HgB A1C  Result Value Ref Range   Hemoglobin A1C 5.5 4.0 - 5.6 %   HbA1c POC (<> result, manual entry)     HbA1c, POC (prediabetic range)     HbA1c, POC (controlled diabetic range)        Assessment & Plan:   Problem List Items Addressed This Visit     Type 2 diabetes mellitus with other specified complication (HCC) - Primary   Relevant Medications   MOUNJARO 10 MG/0.5ML Pen   Other Relevant Orders   POCT HgB A1C (Completed)   Urine Microalbumin w/creat. ratio   Other Visit Diagnoses     Flu vaccine need       Relevant Orders   Flu vaccine trivalent PF, 6mos and older(Flulaval,Afluria,Fluarix,Fluzone) (Completed)   Seasonal allergies       Relevant Medications   azelastine (ASTELIN) 0.1 % nasal spray   Seasonal allergic rhinitis due to other allergic trigger       Relevant Medications   azelastine (ASTELIN) 0.1 % nasal spray   Acute non-recurrent frontal sinusitis       Relevant Medications   azelastine (ASTELIN) 0.1 % nasal spray   amoxicillin (AMOXIL) 500 MG capsule         Type 2 Diabetes Mellitus A1c 5.5, well controlled. Patient reports feeling unwell on Mounjaro 7.5mg  and has gained weight. Previously tolerated 10mg  well. -Increase Mounjaro to 10mg  daily. Previously better tolerated -Continue monitoring blood glucose levels.  Chronic Sinusitis Persistent sinus congestion and bloody discharge despite over-the-counter treatments and prescription Flonase. No signs of infection. -Start Amoxicillin  500mg  twice daily for 10 days. -Try Azelastine nasal spray, 1-2 sprays in each nostril twice daily. -Consider adding nasal saline for additional moisture.  Vestibular Dysfunction Patient  reports dizziness and headaches, currently undergoing vestibular therapy. -Continue with vestibular therapy as directed by therapist.  General Health Maintenance -Complete urine test for kidney function. -Schedule eye exam with [facility]. -Continue Vitamin D supplementation as directed by neurologist, recheck levels in February.  Follow-up in 6 months for physical examination.         Orders Placed This Encounter  Procedures  . Flu vaccine trivalent PF, 6mos and older(Flulaval,Afluria,Fluarix,Fluzone)  . Urine Microalbumin w/creat. ratio  . POCT HgB A1C    Meds ordered this encounter  Medications  . MOUNJARO 10 MG/0.5ML Pen    Sig: Inject 10 mg into the skin once a week.    Dispense:  6 mL    Refill:  1  . azelastine (ASTELIN) 0.1 % nasal spray    Sig: Place 1-2 sprays into both nostrils 2 (two) times daily. Use in each nostril as directed    Dispense:  30 mL    Refill:  5  . amoxicillin (AMOXIL) 500 MG capsule    Sig: Take 1 capsule (500 mg total) by mouth 2 (two) times daily. For 10 days    Dispense:  20 capsule    Refill:  0    Follow up plan: Return in about 6 months (around 10/16/2023) for 6 month fasting lab > 1 week later Annual Physical.  Future labs ordered for 10/27/23  Saralyn Pilar, DO [facility] 04/17/2023, 1:58 PM

## 2023-04-18 LAB — MICROALBUMIN / CREATININE URINE RATIO
Creatinine, Urine: 31 mg/dL (ref 20–275)
Microalb, Ur: <0.2 mg/dL

## 2023-05-11 NOTE — Progress Notes (Signed)
 Started PA  [facility] (Key: [id number]) Need Help? Call us at [phone] Status New (Not sent to plan) Drug Mounjaro 7.5MG /0.5ML auto-injectors ePA cloud logo Form [employer] Electronic PA Form [phone] NCPDP)

## 2023-07-19 ENCOUNTER — Encounter: Payer: Self-pay | Admitting: Family Medicine

## 2023-07-19 DIAGNOSIS — H8111 Benign paroxysmal vertigo, right ear: Secondary | ICD-10-CM

## 2023-07-19 MED ORDER — MECLIZINE HCL 25 MG PO TABS: 25.0000 mg | ORAL_TABLET | Freq: Three times a day (TID) | ORAL | 2 refills | Status: AC | PRN

## 2023-10-02 ENCOUNTER — Other Ambulatory Visit: Payer: Self-pay | Admitting: Family Medicine

## 2023-10-02 DIAGNOSIS — E1169 Type 2 diabetes mellitus with other specified complication: Secondary | ICD-10-CM

## 2023-10-05 ENCOUNTER — Encounter: Payer: Self-pay | Admitting: Family Medicine

## 2023-10-05 NOTE — Telephone Encounter (Signed)
 Requested medication (s) are due for refill today: yes  Requested medication (s) are on the active medication list: yes  Last refill:  04/17/23 6 ml 1 RF  Future visit scheduled: yes  Notes to clinic:  medication not assigned to a protocol   Requested Prescriptions  Pending Prescriptions Disp Refills   MOUNJARO  10 MG/0.5ML Pen [Pharmacy Med Name: MOUNJARO  PEN 0.5ML 4'S 10MG ] 6 mL 3    Sig: INJECT 10 MG UNDER THE SKIN WEEKLY     Off-Protocol Failed - 10/05/2023  9:22 AM      Failed - Medication not assigned to a protocol, review manually.      Failed - Valid encounter within last 12 months    Recent Outpatient Visits   None     Future Appointments             In 3 weeks Karamalegos, Kayleen Party, DO [facility], [facility]

## 2023-10-17 ENCOUNTER — Encounter: Payer: Self-pay | Admitting: Family Medicine

## 2023-10-19 ENCOUNTER — Other Ambulatory Visit: Payer: Self-pay | Admitting: Family Medicine

## 2023-10-19 DIAGNOSIS — E66811 Obesity, class 1: Secondary | ICD-10-CM

## 2023-10-19 DIAGNOSIS — M1A0690 Idiopathic chronic gout, unspecified knee, without tophus (tophi): Secondary | ICD-10-CM

## 2023-10-19 DIAGNOSIS — Z0000 Encounter for general adult medical examination without abnormal findings: Secondary | ICD-10-CM

## 2023-10-19 DIAGNOSIS — E559 Vitamin D deficiency, unspecified: Secondary | ICD-10-CM

## 2023-10-19 DIAGNOSIS — E1169 Type 2 diabetes mellitus with other specified complication: Secondary | ICD-10-CM

## 2023-10-27 ENCOUNTER — Other Ambulatory Visit: Payer: Self-pay

## 2023-10-27 DIAGNOSIS — E559 Vitamin D deficiency, unspecified: Secondary | ICD-10-CM

## 2023-10-27 DIAGNOSIS — Z0000 Encounter for general adult medical examination without abnormal findings: Secondary | ICD-10-CM

## 2023-10-27 DIAGNOSIS — E1169 Type 2 diabetes mellitus with other specified complication: Secondary | ICD-10-CM

## 2023-10-27 DIAGNOSIS — M1A0690 Idiopathic chronic gout, unspecified knee, without tophus (tophi): Secondary | ICD-10-CM

## 2023-10-27 DIAGNOSIS — E66811 Obesity, class 1: Secondary | ICD-10-CM

## 2023-10-28 LAB — CBC WITH DIFFERENTIAL/PLATELET
Absolute Lymphocytes: 2508 {cells}/uL (ref 850–3900)
Absolute Monocytes: 546 {cells}/uL (ref 200–950)
Basophils Absolute: 62 {cells}/uL (ref 0–200)
Basophils Relative: 0.7 %
Eosinophils Absolute: 405 {cells}/uL (ref 15–500)
Eosinophils Relative: 4.6 %
HCT: 42.7 % (ref 35.0–45.0)
Hemoglobin: 13.5 g/dL (ref 11.7–15.5)
MCH: 28.4 pg (ref 27.0–33.0)
MCHC: 31.6 g/dL — ABNORMAL LOW (ref 32.0–36.0)
MCV: 89.7 fL (ref 80.0–100.0)
MPV: 10.9 fL (ref 7.5–12.5)
Monocytes Relative: 6.2 %
Neutro Abs: 5280 {cells}/uL (ref 1500–7800)
Neutrophils Relative %: 60 %
Platelets: 230 10*3/uL (ref 140–400)
RBC: 4.76 10*6/uL (ref 3.80–5.10)
RDW: 12.9 % (ref 11.0–15.0)
Total Lymphocyte: 28.5 %
WBC: 8.8 10*3/uL (ref 3.8–10.8)

## 2023-10-28 LAB — HEMOGLOBIN A1C
Hgb A1c MFr Bld: 5.5 % (ref <5.7)
Mean Plasma Glucose: 111 mg/dL
eAG (mmol/L): 6.2 mmol/L

## 2023-10-28 LAB — LIPID PANEL
Cholesterol: 176 mg/dL (ref <200)
HDL: 51 mg/dL (ref >=50)
LDL Cholesterol (Calc): 97 mg/dL
Non-HDL Cholesterol (Calc): 125 mg/dL (ref <130)
Total CHOL/HDL Ratio: 3.5 (calc) (ref <5.0)
Triglycerides: 187 mg/dL — ABNORMAL HIGH (ref <150)

## 2023-10-28 LAB — COMPREHENSIVE METABOLIC PANEL WITH GFR
AG Ratio: 1.4 (calc) (ref 1.0–2.5)
ALT: 26 U/L (ref 6–29)
AST: 18 U/L (ref 10–30)
Albumin: 4.2 g/dL (ref 3.6–5.1)
Alkaline phosphatase (APISO): 76 U/L (ref 31–125)
BUN: 13 mg/dL (ref 7–25)
CO2: 25 mmol/L (ref 20–32)
Calcium: 9.2 mg/dL (ref 8.6–10.2)
Chloride: 102 mmol/L (ref 98–110)
Creat: 0.53 mg/dL (ref 0.50–0.99)
Globulin: 3.1 g/dL (ref 1.9–3.7)
Glucose, Bld: 107 mg/dL — ABNORMAL HIGH (ref 65–99)
Potassium: 4 mmol/L (ref 3.5–5.3)
Sodium: 138 mmol/L (ref 135–146)
Total Bilirubin: 0.3 mg/dL (ref 0.2–1.2)
Total Protein: 7.3 g/dL (ref 6.1–8.1)
eGFR: 118 mL/min/{1.73_m2} (ref >=60)

## 2023-10-28 LAB — URIC ACID: Uric Acid, Serum: 5.6 mg/dL (ref 2.5–7.0)

## 2023-10-28 LAB — TSH: TSH: 1.33 m[IU]/L

## 2023-10-28 LAB — VITAMIN D 25 HYDROXY (VIT D DEFICIENCY, FRACTURES): Vit D, 25-Hydroxy: 40 ng/mL (ref 30–100)

## 2023-11-01 ENCOUNTER — Encounter: Payer: Self-pay | Admitting: Family Medicine

## 2023-11-01 ENCOUNTER — Ambulatory Visit: Payer: Self-pay | Admitting: Family Medicine

## 2023-11-01 VITALS — BP 126/76 | HR 95 | Ht 65.0 in | Wt 211.2 lb

## 2023-11-01 DIAGNOSIS — F410 Panic disorder [episodic paroxysmal anxiety] without agoraphobia: Secondary | ICD-10-CM

## 2023-11-01 DIAGNOSIS — F411 Generalized anxiety disorder: Secondary | ICD-10-CM

## 2023-11-01 DIAGNOSIS — Z23 Encounter for immunization: Secondary | ICD-10-CM | POA: Diagnosis not present

## 2023-11-01 DIAGNOSIS — Z0000 Encounter for general adult medical examination without abnormal findings: Secondary | ICD-10-CM | POA: Diagnosis not present

## 2023-11-01 DIAGNOSIS — E785 Hyperlipidemia, unspecified: Secondary | ICD-10-CM

## 2023-11-01 DIAGNOSIS — Z7985 Long-term (current) use of injectable non-insulin antidiabetic drugs: Secondary | ICD-10-CM

## 2023-11-01 DIAGNOSIS — E1169 Type 2 diabetes mellitus with other specified complication: Secondary | ICD-10-CM

## 2023-11-01 DIAGNOSIS — E66811 Obesity, class 1: Secondary | ICD-10-CM | POA: Diagnosis not present

## 2023-11-01 MED ORDER — LOSARTAN POTASSIUM 25 MG PO TABS: ORAL_TABLET | ORAL | 3 refills | Status: AC

## 2023-11-01 MED ORDER — BUSPIRONE HCL 5 MG PO TABS: 5.0000 mg | ORAL_TABLET | Freq: Three times a day (TID) | ORAL | 1 refills | Status: AC | PRN

## 2023-11-01 NOTE — Patient Instructions (Addendum)
 Thank you for coming to the office today.  Next Pap Smear due later this year, 2025.  Please call and check with Maryl SHIPPER  Please schedule a Follow-up Appointment to: Return in about 6 months (around 05/02/2024) for 6 months follow-up DM A1c, then physical again in Summer 2026.  If you have any other questions or concerns, please feel free to call the office or send a message through MyChart. You may also schedule an earlier appointment if necessary.  Additionally, you may be receiving a survey about your experience at our office within a few days to 1 week by e-mail or mail. We value your feedback.  Marsa Officer, DO [facility], [place]

## 2023-11-01 NOTE — Progress Notes (Signed)
 Subjective:    Patient ID: Diana Cunningham, female    DOB: 1980/09/02, 43 y.o.   MRN: [id number]  Diana Cunningham is a 43 y.o. female presenting on 11/01/2023 for Annual Exam   HPI  Discussed the use of AI scribe software for clinical note transcription with the patient, who gave verbal consent to proceed.  History of Present Illness   Diana Cunningham is a 43 year old female who presents for a routine follow-up visit.   CHRONIC DM, Type 2 / Obesity BMI >35 w comorbidities Hyperlipidemia  A1c improved stable at 5.5, previously had some nausea vomiting side effect but has been stable now Significant improved sugars. Meds: Mounjaro  10mg  weekly Off Metformin  Tolerating med well, no further nausea side effect Currently on ARB (Losartan  half tab dose 12.5mg ) Lifestyle: - Diet (significantly improved diet and eating patterns, less craving sugar) - Exercise (increasing activity, no particular regimen currently) Foot Callus bilateral. Denies hypoglycemia, polyuria, visual changes, numbness or tingling  She will scheduled w/ [facility] for DM Eye exam soon  Gout and uric acid levels - History of gout with no recent flares - Uric acid level stable at 5.6 - Not currently taking gout medication  Peripheral neuropathy and skin integrity - No numbness or tingling in the feet - Scabs present from mosquito bites - No skin sores or unusual symptoms  Vitamin d deficiency - Vitamin D level improved to 40 - Taking 6500 IU of vitamin D four days per week - Previously experienced low vitamin D levels despite supplementation  Musculoskeletal symptoms: neck tension and dizziness - Neck tension and dizziness attributed to poor workstation ergonomics - Workstation adjustments and exercises performed - Improvement in neck symptoms since interventions    Following with [facility] for vertigo dizziness, has been improving exercises for neck muscles and adjustment to desk     HYPERLIPIDEMIA: - Reports no concerns. 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 DEPRESSION, Major - In Remission:  Off Escitalopram  now doing well On Buspar  more frequently now with improvement Denies chest discomfort, dyspnea, sweating, headache, nausea vomiting    Health Maintenance: Due for pap smear  Prevnar-20 today, booster. Last pneumonvax-23 2017      11/01/2023    4:31 PM 04/17/2023    1:37 PM 10/14/2022    9:10 AM  Depression screen PHQ 2/9  Decreased Interest 0 0 0  Down, Depressed, Hopeless 0 0 0  PHQ - 2 Score 0 0 0  Altered sleeping 2 1 0  Tired, decreased energy 2 1 0  Change in appetite 0 0 0  Feeling bad or failure about yourself  0 0 0  Trouble concentrating 0 0 0  Moving slowly or fidgety/restless 0 0 0  Suicidal thoughts 0 0 0  PHQ-9 Score 4 2 0  Difficult doing work/chores Not difficult at all  Not difficult at all       11/01/2023    4:32 PM 04/17/2023    1:37 PM 10/14/2022    9:11 AM 06/10/2022    9:16 AM  GAD 7 : Generalized Anxiety Score  Nervous, Anxious, on Edge 2 0 0 1  Control/stop worrying 0 0 0 0  Worry too much - different things 1 0  0 0  Trouble relaxing 1 0 0 0  Restless 1 0 0 0  Easily annoyed or irritable 1 0 0 0  Afraid - awful might happen 0 0 0 0  Total GAD 7 Score 6 0 0 1  Anxiety Difficulty Not difficult at all  Not difficult at all Not difficult at all     Past Medical History:  Diagnosis Date   Pilonidal cyst    Past Surgical History:  Procedure Laterality Date   PILONIDAL CYST EXCISION  2005   Social History   Socioeconomic History   Marital status: Single    Spouse name: Not on file   Number of children: Not on file   Years of education:  Not on file   Highest education level: Not on file  Occupational History   Not on file  Tobacco Use   Smoking status: Former    Current packs/day: 0.00    Average packs/day: 1 pack/day for 1 year (1.0 ttl pk-yrs)    Types: Cigarettes    Start date: 05/09/1998    Quit date: 05/10/1999    Years since quitting: 24.4   Smokeless tobacco: Former  Substance and Sexual Activity   Alcohol use: Yes    Alcohol/week: 0.0 standard drinks of alcohol    Comment: on occasion   Drug use: No   Sexual activity: Yes  Other Topics Concern   Not on file  Social History Narrative   Not on file   Social Drivers of Health   Financial Resource Strain: Not on file  Food Insecurity: Not on file  Transportation Needs: Not on file  Physical Activity: Not on file  Stress: Not on file  Social Connections: Not on file  Intimate Partner Violence: Not on file   Family History  Problem Relation Age of Onset   Hypertension Mother    Depression Mother    Basal cell carcinoma Mother    Breast cancer Neg Hx    Colon cancer Neg Hx    Current Outpatient Medications on File Prior to Visit  Medication Sig   fluticasone  (FLONASE ) 50 MCG/ACT nasal spray Place 2 sprays into both nostrils daily. Use for 4-6 weeks then stop and use seasonally or as needed.   Loratadine (CLARITIN PO) Claritin   meclizine  (ANTIVERT ) 25 MG tablet Take 1 tablet (25 mg total) by mouth 3 (three) times daily as needed for dizziness.   montelukast  (SINGULAIR ) 10 MG tablet Take 1 tablet (10 mg total) by mouth at bedtime.   MOUNJARO  10 MG/0.5ML Pen INJECT 10 MG UNDER THE SKIN WEEKLY   Multiple Vitamin (MULTI-VITAMIN) tablet Take 1 tablet by mouth daily.   ondansetron  (ZOFRAN -ODT) 4 MG disintegrating tablet DISSOLVE 1 TABLET(4 MG) ON THE TONGUE EVERY 8 HOURS AS NEEDED FOR NAUSEA OR VOMITING   ONETOUCH DELICA LANCETS 33G MISC    SUMAtriptan (IMITREX) 100 MG tablet Take 100 mg by mouth daily as needed for migraine or headache.   albuterol   (VENTOLIN  HFA) 108 (90 Base) MCG/ACT inhaler Inhale 2 puffs into the lungs every 4 (four) hours as needed for wheezing or shortness of breath. (Patient not taking: Reported on 11/01/2023)   Blood Glucose Monitoring Suppl (ONE TOUCH ULTRA 2) w/Device KIT OneTouch Ultra2 Meter kit  TK UTD BID (Patient not taking: Reported on 11/01/2023)   No current facility-administered medications on file prior to visit.    Review of Systems  Constitutional:  Negative for activity change, appetite change, chills, diaphoresis, fatigue and fever.  HENT:  Negative for congestion and hearing loss.   Eyes:  Negative for visual disturbance.  Respiratory:  Negative for cough, chest tightness, shortness of breath and wheezing.   Cardiovascular:  Negative for chest pain, palpitations and leg swelling.  Gastrointestinal:  Negative for abdominal pain, constipation, diarrhea, nausea and vomiting.  Genitourinary:  Negative for dysuria, frequency and hematuria.  Musculoskeletal:  Negative for arthralgias and neck pain.  Skin:  Negative for rash.  Neurological:  Negative for dizziness, weakness, light-headedness, numbness and headaches.  Hematological:  Negative for adenopathy.  Psychiatric/Behavioral:  Negative for behavioral problems, dysphoric mood and sleep disturbance.    Per HPI unless specifically indicated above     Objective:    BP 126/76 (BP Location: Right Arm, Patient Position: Sitting, Cuff Size: Normal)   Pulse 95   Ht 5' 5 (1.651 m)   Wt 211 lb 3.2 oz (95.8 kg)   SpO2 96%   BMI 35.15 kg/m   Wt Readings from Last 3 Encounters:  11/01/23 211 lb 3.2 oz (95.8 kg)  04/17/23 202 lb (91.6 kg)  10/14/22 188 lb 6.4 oz (85.5 kg)    Physical Exam Vitals and nursing note reviewed.  Constitutional:      General: She is not in acute distress.    Appearance: She is well-developed. She is not diaphoretic.     Comments: Well-appearing, comfortable, cooperative  HENT:     Head: Normocephalic and atraumatic.    Eyes:     General:        Right eye: No discharge.        Left eye: No discharge.     Conjunctiva/sclera: Conjunctivae normal.     Pupils: Pupils are equal, round, and reactive to light.   Neck:     Thyroid : No thyromegaly.     Vascular: No carotid bruit.   Cardiovascular:     Rate and Rhythm: Normal rate and regular rhythm.     Pulses: Normal pulses.     Heart sounds: Normal heart sounds. No murmur heard. Pulmonary:     Effort: Pulmonary effort is normal. No respiratory distress.     Breath sounds: Normal breath sounds. No wheezing or rales.  Abdominal:     General: Bowel sounds are normal. There is no distension.     Palpations: Abdomen is soft. There is no mass.     Tenderness: There is no abdominal tenderness.   Musculoskeletal:        General: No tenderness. Normal range of motion.     Cervical back: Normal range of motion and neck supple.     Right lower leg: No edema.     Left lower leg: No edema.     Comments: Upper / Lower Extremities: - Normal muscle tone, strength bilateral upper extremities 5/5, lower extremities 5/5  Lymphadenopathy:     Cervical: No cervical adenopathy.   Skin:    General: Skin is warm and dry.     Findings: No erythema or rash.   Neurological:     Mental Status: She is alert and oriented to person, place, and time.     Comments: Distal sensation intact to light touch all extremities  Psychiatric:        Mood and Affect: Mood normal.        Behavior: Behavior normal.        Thought Content: Thought content normal.     Comments: Well groomed, good eye contact, normal speech and thoughts     Diabetic Foot Exam -  Simple   Simple Foot Form Diabetic Foot exam was performed with the following findings: Yes 11/01/2023  4:12 PM  Visual Inspection No deformities, no ulcerations, no other skin breakdown bilaterally: Yes Sensation Testing Intact to touch and monofilament testing bilaterally: Yes Pulse Check Posterior Tibialis and Dorsalis  pulse intact bilaterally: Yes Comments      Results for orders placed or performed in visit on 10/27/23  Uric acid   Collection Time: 10/27/23  8:05 AM  Result Value Ref Range   Uric Acid, Serum 5.6 2.5 - 7.0 mg/dL  VITAMIN D 25 Hydroxy (Vit-D Deficiency, Fractures)   Collection Time: 10/27/23  8:05 AM  Result Value Ref Range   Vit D, 25-Hydroxy 40 30 - 100 ng/mL  Comprehensive metabolic panel with GFR   Collection Time: 10/27/23  8:05 AM  Result Value Ref Range   Glucose, Bld 107 (H) 65 - 99 mg/dL   BUN 13 7 - 25 mg/dL   Creat 9.46 9.49 - 9.00 mg/dL   eGFR 881 > OR = 60 fO/fpw/8.26f7   BUN/Creatinine Ratio SEE NOTE: 6 - 22 (calc)   Sodium 138 135 - 146 mmol/L   Potassium 4.0 3.5 - 5.3 mmol/L   Chloride 102 98 - 110 mmol/L   CO2 25 20 - 32 mmol/L   Calcium 9.2 8.6 - 10.2 mg/dL   Total Protein 7.3 6.1 - 8.1 g/dL   Albumin 4.2 3.6 - 5.1 g/dL   Globulin 3.1 1.9 - 3.7 g/dL (calc)   AG Ratio 1.4 1.0 - 2.5 (calc)   Total Bilirubin 0.3 0.2 - 1.2 mg/dL   Alkaline phosphatase (APISO) 76 31 - 125 U/L   AST 18 10 - 30 U/L   ALT 26 6 - 29 U/L  TSH   Collection Time: 10/27/23  8:05 AM  Result Value Ref Range   TSH 1.33 mIU/L  CBC with Differential/Platelet   Collection Time: 10/27/23  8:05 AM  Result Value Ref Range   WBC 8.8 3.8 - 10.8 Thousand/uL   RBC 4.76 3.80 - 5.10 Million/uL   Hemoglobin 13.5 11.7 - 15.5 g/dL   HCT 57.2 64.9 - 54.9 %   MCV 89.7 80.0 - 100.0 fL   MCH 28.4 27.0 - 33.0 pg   MCHC 31.6 (L) 32.0 - 36.0 g/dL   RDW 87.0 88.9 - 84.9 %   Platelets 230 140 - 400 Thousand/uL   MPV 10.9 7.5 - 12.5 fL   Neutro Abs 5,280 1,500 - 7,800 cells/uL   Absolute Lymphocytes 2,508 850 - 3,900 cells/uL   Absolute Monocytes 546 200 - 950 cells/uL   Eosinophils Absolute 405 15 - 500 cells/uL   Basophils Absolute 62 0 - 200 cells/uL   Neutrophils Relative % 60 %   Total Lymphocyte 28.5 %   Monocytes Relative 6.2 %   Eosinophils Relative 4.6 %   Basophils Relative 0.7 %   Hemoglobin A1c   Collection Time: 10/27/23  8:05 AM  Result Value Ref Range   Hgb A1c MFr Bld 5.5 <5.7 %   Mean Plasma Glucose 111 mg/dL   eAG (mmol/L) 6.2 mmol/L  Lipid panel   Collection Time: 10/27/23  8:05 AM  Result Value Ref Range   Cholesterol 176 <200 mg/dL   HDL 51 > OR = 50 mg/dL   Triglycerides 812 (H) <150 mg/dL   LDL Cholesterol (Calc) 97 mg/dL (calc)   Total CHOL/HDL Ratio 3.5 <5.0 (calc)   Non-HDL Cholesterol (Calc) 125 <130 mg/dL (calc)  Assessment & Plan:   Problem List Items Addressed This Visit     Generalized anxiety disorder with panic attacks   Relevant Medications   busPIRone  (BUSPAR ) 5 MG tablet   Hyperlipidemia associated with type 2 diabetes mellitus (HCC)   Relevant Medications   losartan  (COZAAR ) 25 MG tablet   Obesity (BMI 30.0-34.9)   Type 2 diabetes mellitus with other specified complication (HCC)   Relevant Medications   losartan  (COZAAR ) 25 MG tablet   Other Visit Diagnoses       Annual physical exam    -  Primary     Need for Streptococcus pneumoniae vaccination       Relevant Orders   Pneumococcal conjugate vaccine 20-valent (Completed)        Updated Health Maintenance information Reviewed recent lab results with patient Encouraged improvement to lifestyle with diet and exercise Goal of weight loss  Type 2 Diabetes Mellitus Diabetes well-controlled with A1c of 5.5%. Mounjaro  10 mg effective and well-tolerated. Weight gain noted, possibly due to lifestyle or medication. - Continue Mounjaro  10 mg. - Monitor weight and encourage lifestyle modifications. - Schedule follow-up in six months for glucose monitoring.  Hypertension Hypertension well-controlled with Losartan . - Renew Losartan  prescription.  Hyperlipidemia LDL slightly elevated but under 100 mg/dL. No statin required due to stable cholesterol and absence of coronary artery disease. - Continue monitoring cholesterol levels without statin therapy.  Gout Gout  well-managed with uric acid at 5.6 mg/dL. No recent attacks.  Anxiety Increased anxiety managed with Buspar , well-tolerated and effective. - Renew Buspar  prescription for 270 tablets, up to three times daily use.  General Health Maintenance Due for Pap smear and eligible for Prevnar 20 vaccine. Maintains normal vitamin D levels with supplementation. - Administer Prevnar 20 vaccine. - Encourage scheduling of Pap smear. W/ GYN - Continue vitamin D supplementation.  Follow-up Upcoming neurology and cardiology appointments. December follow-up for diabetes and health monitoring. - Schedule follow-up appointment in December for glucose monitoring.        Orders Placed This Encounter  Procedures   Pneumococcal conjugate vaccine 20-valent    Meds ordered this encounter  Medications   busPIRone  (BUSPAR ) 5 MG tablet    Sig: Take 1 tablet (5 mg total) by mouth 3 (three) times daily as needed (anxiety).    Dispense:  270 tablet    Refill:  1   losartan  (COZAAR ) 25 MG tablet    Sig: TAKE 1/2 TABLET(12.5 MG) BY MOUTH DAILY    Dispense:  45 tablet    Refill:  3     Follow up plan: Return in about 6 months (around 05/02/2024) for 6 months follow-up DM A1c, then physical again in Summer 2026.  Marsa Officer, DO [facility] 11/01/2023, 4:07 PM

## 2024-01-01 ENCOUNTER — Encounter: Payer: Self-pay | Admitting: Family Medicine

## 2024-01-01 DIAGNOSIS — E1169 Type 2 diabetes mellitus with other specified complication: Secondary | ICD-10-CM

## 2024-01-01 MED ORDER — MOUNJARO 7.5 MG/0.5ML ~~LOC~~ SOAJ: 7.5000 mg | SUBCUTANEOUS | 1 refills | Status: AC

## 2024-01-01 NOTE — Addendum Note (Signed)
 Addended by: EDMAN MARSA PARAS on: 01/01/2024 07:34 PM   Modules accepted: Orders

## 2024-01-15 ENCOUNTER — Encounter: Payer: Self-pay | Admitting: Family Medicine

## 2024-01-15 DIAGNOSIS — R110 Nausea: Secondary | ICD-10-CM

## 2024-01-15 MED ORDER — ONDANSETRON 4 MG PO TBDP: ORAL_TABLET | ORAL | 2 refills | Status: AC

## 2024-03-12 ENCOUNTER — Encounter: Payer: Self-pay | Admitting: Family Medicine

## 2024-03-12 DIAGNOSIS — J9809 Other diseases of bronchus, not elsewhere classified: Secondary | ICD-10-CM

## 2024-03-12 MED ORDER — ALBUTEROL SULFATE HFA 108 (90 BASE) MCG/ACT IN AERS: 2.0000 | INHALATION_SPRAY | RESPIRATORY_TRACT | 2 refills | Status: AC | PRN

## 2024-04-15 ENCOUNTER — Other Ambulatory Visit (HOSPITAL_COMMUNITY): Payer: Self-pay

## 2024-04-16 ENCOUNTER — Telehealth: Payer: Self-pay

## 2024-04-16 ENCOUNTER — Other Ambulatory Visit (HOSPITAL_COMMUNITY): Payer: Self-pay

## 2024-04-16 NOTE — Telephone Encounter (Signed)
 Pharmacy Patient Advocate Encounter   Received notification from Onbase that prior authorization for Mounjaro  7.5 is required/requested.   Insurance verification completed.   The patient is insured through [employer].   Per test claim: Refill too soon. PA is not needed at this time. Medication was filled 03/21/24. Next eligible fill date is 06/02/24.

## 2024-04-22 ENCOUNTER — Other Ambulatory Visit (HOSPITAL_COMMUNITY): Payer: Self-pay

## 2024-05-03 ENCOUNTER — Ambulatory Visit: Admitting: Family Medicine

## 2024-10-25 ENCOUNTER — Other Ambulatory Visit

## 2024-11-01 ENCOUNTER — Encounter: Admitting: Family Medicine
